# Patient Record
Sex: Female | Born: 1980 | Race: Asian | Hispanic: No | Smoking: Never smoker
Health system: Southern US, Community
[De-identification: ages and names within clinical notes are randomized; demographics above are authoritative.]

## PROBLEM LIST (undated history)

## (undated) DIAGNOSIS — E119 Type 2 diabetes mellitus without complications: Secondary | ICD-10-CM

---

## 2009-07-12 ENCOUNTER — Encounter: Admission: RE | Admit: 2009-07-12 | Discharge: 2009-10-10 | Payer: Self-pay | Admitting: Obstetrics and Gynecology

## 2009-07-12 ENCOUNTER — Ambulatory Visit (HOSPITAL_COMMUNITY): Admission: RE | Admit: 2009-07-12 | Discharge: 2009-07-12 | Payer: Self-pay | Admitting: Obstetrics and Gynecology

## 2019-09-06 ENCOUNTER — Emergency Department (HOSPITAL_COMMUNITY): Payer: BC Managed Care – PPO

## 2019-09-06 ENCOUNTER — Other Ambulatory Visit: Payer: Self-pay

## 2019-09-06 ENCOUNTER — Ambulatory Visit (HOSPITAL_COMMUNITY)
Admission: EM | Admit: 2019-09-06 | Discharge: 2019-09-06 | Disposition: A | Discharge status: Other Acute Inpatient Hospital | Payer: BC Managed Care – PPO | Source: Home / Self Care

## 2019-09-06 ENCOUNTER — Encounter (HOSPITAL_COMMUNITY): Payer: Self-pay | Admitting: Emergency Medicine

## 2019-09-06 ENCOUNTER — Inpatient Hospital Stay (HOSPITAL_COMMUNITY)
Admission: EM | Admit: 2019-09-06 | Discharge: 2019-09-10 | DRG: 177 | Disposition: A | Discharge status: Home / Self Care | Payer: BC Managed Care – PPO | Source: Ambulatory Visit | Attending: Internal Medicine | Admitting: Internal Medicine

## 2019-09-06 DIAGNOSIS — E119 Type 2 diabetes mellitus without complications: Secondary | ICD-10-CM | POA: Diagnosis not present

## 2019-09-06 DIAGNOSIS — R011 Cardiac murmur, unspecified: Secondary | ICD-10-CM | POA: Diagnosis present

## 2019-09-06 DIAGNOSIS — R0602 Shortness of breath: Secondary | ICD-10-CM | POA: Diagnosis present

## 2019-09-06 DIAGNOSIS — J189 Pneumonia, unspecified organism: Secondary | ICD-10-CM

## 2019-09-06 DIAGNOSIS — U071 COVID-19: Secondary | ICD-10-CM | POA: Diagnosis present

## 2019-09-06 DIAGNOSIS — R739 Hyperglycemia, unspecified: Secondary | ICD-10-CM

## 2019-09-06 DIAGNOSIS — J9600 Acute respiratory failure, unspecified whether with hypoxia or hypercapnia: Secondary | ICD-10-CM | POA: Diagnosis present

## 2019-09-06 DIAGNOSIS — Y92230 Patient room in hospital as the place of occurrence of the external cause: Secondary | ICD-10-CM | POA: Diagnosis not present

## 2019-09-06 DIAGNOSIS — E1165 Type 2 diabetes mellitus with hyperglycemia: Secondary | ICD-10-CM | POA: Diagnosis present

## 2019-09-06 DIAGNOSIS — T380X5A Adverse effect of glucocorticoids and synthetic analogues, initial encounter: Secondary | ICD-10-CM | POA: Diagnosis not present

## 2019-09-06 DIAGNOSIS — J9601 Acute respiratory failure with hypoxia: Secondary | ICD-10-CM | POA: Diagnosis not present

## 2019-09-06 DIAGNOSIS — J1282 Pneumonia due to coronavirus disease 2019: Secondary | ICD-10-CM | POA: Diagnosis present

## 2019-09-06 DIAGNOSIS — K0889 Other specified disorders of teeth and supporting structures: Secondary | ICD-10-CM | POA: Diagnosis present

## 2019-09-06 DIAGNOSIS — Z91013 Allergy to seafood: Secondary | ICD-10-CM | POA: Diagnosis not present

## 2019-09-06 DIAGNOSIS — Z833 Family history of diabetes mellitus: Secondary | ICD-10-CM

## 2019-09-06 HISTORY — DX: Type 2 diabetes mellitus without complications: E11.9

## 2019-09-06 LAB — CBC WITH DIFFERENTIAL/PLATELET
Abs Immature Granulocytes: 0.04 10*3/uL (ref 0.00–0.07)
Basophils Absolute: 0 10*3/uL (ref 0.0–0.1)
Basophils Relative: 0 %
Eosinophils Absolute: 0 10*3/uL (ref 0.0–0.5)
Eosinophils Relative: 0 %
HCT: 37.2 % (ref 36.0–46.0)
Hemoglobin: 11.7 g/dL — ABNORMAL LOW (ref 12.0–15.0)
Immature Granulocytes: 1 %
Lymphocytes Relative: 14 %
Lymphs Abs: 1.1 10*3/uL (ref 0.7–4.0)
MCH: 22.7 pg — ABNORMAL LOW (ref 26.0–34.0)
MCHC: 31.5 g/dL (ref 30.0–36.0)
MCV: 72.2 fL — ABNORMAL LOW (ref 80.0–100.0)
Monocytes Absolute: 0.3 10*3/uL (ref 0.1–1.0)
Monocytes Relative: 5 %
Neutro Abs: 6.1 10*3/uL (ref 1.7–7.7)
Neutrophils Relative %: 80 %
Platelets: 292 10*3/uL (ref 150–400)
RBC: 5.15 MIL/uL — ABNORMAL HIGH (ref 3.87–5.11)
RDW: 13.7 % (ref 11.5–15.5)
WBC: 7.6 10*3/uL (ref 4.0–10.5)
nRBC: 0 % (ref 0.0–0.2)

## 2019-09-06 LAB — COMPREHENSIVE METABOLIC PANEL
ALT: 19 U/L (ref 0–44)
AST: 27 U/L (ref 15–41)
Albumin: 3.6 g/dL (ref 3.5–5.0)
Alkaline Phosphatase: 51 U/L (ref 38–126)
Anion gap: 14 (ref 5–15)
BUN: 6 mg/dL (ref 6–20)
CO2: 23 mmol/L (ref 22–32)
Calcium: 9 mg/dL (ref 8.9–10.3)
Chloride: 97 mmol/L — ABNORMAL LOW (ref 98–111)
Creatinine, Ser: 0.81 mg/dL (ref 0.44–1.00)
GFR calc Af Amer: >60 mL/min (ref >60)
GFR calc non Af Amer: >60 mL/min (ref >60)
Glucose, Bld: 233 mg/dL — ABNORMAL HIGH (ref 70–99)
Potassium: 3.8 mmol/L (ref 3.5–5.1)
Sodium: 134 mmol/L — ABNORMAL LOW (ref 135–145)
Total Bilirubin: 0.7 mg/dL (ref 0.3–1.2)
Total Protein: 8.1 g/dL (ref 6.5–8.1)

## 2019-09-06 LAB — URINALYSIS, ROUTINE W REFLEX MICROSCOPIC
Bacteria, UA: NONE SEEN
Bilirubin Urine: NEGATIVE
Glucose, UA: >=500 mg/dL — AB
Hgb urine dipstick: NEGATIVE
Ketones, ur: 80 mg/dL — AB
Leukocytes,Ua: NEGATIVE
Nitrite: NEGATIVE
Protein, ur: 30 mg/dL — AB
Specific Gravity, Urine: 1.01 (ref 1.005–1.030)
pH: 7 (ref 5.0–8.0)

## 2019-09-06 LAB — I-STAT BETA HCG BLOOD, ED (MC, WL, AP ONLY): I-stat hCG, quantitative: <5 m[IU]/mL (ref <5)

## 2019-09-06 LAB — PROTIME-INR
INR: 1.1 (ref 0.8–1.2)
Prothrombin Time: 13.6 seconds (ref 11.4–15.2)

## 2019-09-06 LAB — LACTIC ACID, PLASMA: Lactic Acid, Venous: 1.4 mmol/L (ref 0.5–1.9)

## 2019-09-06 MED ORDER — SODIUM CHLORIDE 0.9 % IV SOLN
100.0000 mg | INTRAVENOUS | Status: AC
  Administered 2019-09-07 (×2): 100 mg via INTRAVENOUS
  Filled 2019-09-06: qty 20

## 2019-09-06 MED ORDER — INSULIN ASPART 100 UNIT/ML ~~LOC~~ SOLN
0.0000 [IU] | Freq: Three times a day (TID) | SUBCUTANEOUS | Status: DC
  Administered 2019-09-07: 8 [IU] via SUBCUTANEOUS
  Administered 2019-09-07: 11 [IU] via SUBCUTANEOUS
  Administered 2019-09-07: 15 [IU] via SUBCUTANEOUS
  Administered 2019-09-08: 8 [IU] via SUBCUTANEOUS
  Administered 2019-09-08 (×2): 15 [IU] via SUBCUTANEOUS
  Administered 2019-09-09: 11 [IU] via SUBCUTANEOUS
  Administered 2019-09-10: 8 [IU] via SUBCUTANEOUS

## 2019-09-06 MED ORDER — AZITHROMYCIN 500 MG IV SOLR
500.0000 mg | Freq: Once | INTRAVENOUS | Status: AC
  Administered 2019-09-06: 23:00:00 500 mg via INTRAVENOUS
  Filled 2019-09-06: qty 500

## 2019-09-06 MED ORDER — ACETAMINOPHEN 500 MG PO TABS
500.0000 mg | ORAL_TABLET | Freq: Once | ORAL | Status: AC
  Administered 2019-09-06: 23:00:00 500 mg via ORAL
  Filled 2019-09-06: qty 1

## 2019-09-06 MED ORDER — CEFTRIAXONE SODIUM 1 G IJ SOLR
1.0000 g | Freq: Once | INTRAMUSCULAR | Status: AC
  Administered 2019-09-06: 23:00:00 1 g via INTRAVENOUS
  Filled 2019-09-06: qty 10

## 2019-09-06 MED ORDER — INSULIN GLARGINE 100 UNIT/ML ~~LOC~~ SOLN
5.0000 [IU] | Freq: Every day | SUBCUTANEOUS | Status: DC
  Administered 2019-09-07: 5 [IU] via SUBCUTANEOUS
  Filled 2019-09-06 (×2): qty 0.05

## 2019-09-06 MED ORDER — INSULIN ASPART 100 UNIT/ML ~~LOC~~ SOLN
0.0000 [IU] | Freq: Every day | SUBCUTANEOUS | Status: DC
  Administered 2019-09-07 (×2): 2 [IU] via SUBCUTANEOUS
  Administered 2019-09-08: 3 [IU] via SUBCUTANEOUS

## 2019-09-06 MED ORDER — SODIUM CHLORIDE 0.9 % IV SOLN
100.0000 mg | Freq: Every day | INTRAVENOUS | Status: DC
  Administered 2019-09-08 – 2019-09-10 (×3): 100 mg via INTRAVENOUS
  Filled 2019-09-06 (×5): qty 20

## 2019-09-06 MED ORDER — ACETAMINOPHEN 325 MG PO TABS
650.0000 mg | ORAL_TABLET | Freq: Once | ORAL | Status: AC
  Administered 2019-09-06: 650 mg via ORAL

## 2019-09-06 MED ORDER — ACETAMINOPHEN 325 MG PO TABS
ORAL_TABLET | ORAL | Status: AC
  Filled 2019-09-06: qty 2

## 2019-09-06 NOTE — ED Triage Notes (Signed)
Pt arrives to ED from [facility] with complaints of shortness of breath and fevers that has worsened since her COVID+ test on 3/9. Patient states her fevers are not controlled by OTC medications and her coughing in uncontrollable. Patient room air sat is 89% and 94% on 2L [place].

## 2019-09-06 NOTE — ED Provider Notes (Signed)
This patient is a ill-appearing 39 year old female coming from urgent care after having increasing shortness of breath and fevers that have worsened over the last week since she was diagnosed with Covid.  The patient has been taking her Tylenol and ibuprofen but continues to be short of breath and febrile.  On exam the patient is hypoxic and has rales in all lung fields.  She is mildly tachypneic, she improves with supplemental oxygen but has a temperature of 101.9, a pulse of close to 100, thankfully no leukocytosis I suspect this is viral.  Nevertheless given the degree to her hypoxia and respiratory distress she will need to be admitted to the hospital, antibiotics have been ordered, I discussed the case with the hospitalist who will admit to the hospital.  Appreciate Dr. Adela Glimpse for seeing and admitting this nice patient.  .Critical Care Performed by: Eber Hong, MD Authorized by: Eber Hong, MD   Critical care provider statement:    Critical care time (minutes):  35   Critical care time was exclusive of:  Separately billable procedures and treating other patients and teaching time   Critical care was necessary to treat or prevent imminent or life-threatening deterioration of the following conditions:  Respiratory failure   Critical care was time spent personally by me on the following activities:  Blood draw for specimens, development of treatment plan with patient or surrogate, discussions with consultants, evaluation of patient's response to treatment, examination of patient, obtaining history from patient or surrogate, ordering and performing treatments and interventions, ordering and review of laboratory studies, ordering and review of radiographic studies, pulse oximetry, re-evaluation of patient's condition and review of old charts    EKG Interpretation  Date/Time:  Tuesday September 06 2019 18:32:08 EDT Ventricular Rate:  109 PR Interval:  128 QRS Duration: 78 QT  Interval:  324 QTC Calculation: 436 R Axis:   24 Text Interpretation: Sinus tachycardia Cannot rule out Anterior infarct , age undetermined Abnormal ECG No old tracing to compare Confirmed by Eber Hong ([id number]) on 09/06/2019 10:54:05 PM      Phyllis Hanson was evaluated in Emergency Department on 09/06/2019 for the symptoms described in the history of present illness. She was evaluated in the context of the global COVID-19 pandemic, which necessitated consideration that the patient might be at risk for infection with the SARS-CoV-2 virus that causes COVID-19. Institutional protocols and algorithms that pertain to the evaluation of patients at risk for COVID-19 are in a state of rapid change based on information released by regulatory bodies including the CDC and federal and state organizations. These policies and algorithms were followed during the patient's care in the ED.  Medical screening examination/treatment/procedure(s) were conducted as a shared visit with non-physician practitioner(s) and myself.  I personally evaluated the patient during the encounter.  Clinical Impression:   Final diagnoses:  Community acquired pneumonia, unspecified laterality  COVID-19  Hyperglycemia  Acute respiratory failure with hypoxia (HCC)         Eber Hong, MD 09/08/19 1529

## 2019-09-06 NOTE — H&P (Addendum)
Phyllis Hanson JQB:[id number] DOB: 1981-05-02 DOA: 09/06/2019     PCP: Patient, No Pcp Per   Outpatient Specialists:  NONE    Patient arrived to ER on 09/06/19 at 1822  Patient coming from: home Lives   With family    Chief Complaint:   Chief Complaint  Patient presents with  . COVID+  . Shortness of Breath    HPI: Phyllis Hanson is a 39 y.o. female with medical history significant of Dm 2    Presented with 10-day history of shoulder back and chest pain generalized body aches.  Denies nausea vomiting or diarrhea.  Initially her daughter and then her son of 43 years old were exposed and tested positive. She herself finally tested positive on 9th of March.  Since that her symptoms has progressed since she has significant shortness of breath which brought into emergency department   Infectious risk factors:  Reports  fever, shortness of breath, dry cough, chest pain,    KNOWN COVID POSITIVE      in house  PCR testing  Pending  No results found for: SARSCOV2NAA   Regarding pertinent Chronic problems:    DM 2 -   diet controlled      While in ER: Noted to be hypoxic requiring 2 L of oxygen chest x-ray with bilateral infiltrates    Hospitalist was called for admission for COVID-19 positive test (U07.1, COVID-19) with Acute Pneumonia (J12.89, Other viral pneumonia) (If respiratory failure or sepsis present, add as separate assessment)   The following Work up has been ordered so far:  Orders Placed This Encounter  Procedures  . Critical Care  . Culture, blood (Routine x 2)  . DG Chest Portable 1 View  . Comprehensive metabolic panel  . Lactic acid, plasma  . CBC with Differential  . Protime-INR  . Urinalysis, Routine w reflex microscopic  . D-dimer, quantitative  . Procalcitonin  . Lactate dehydrogenase  . Ferritin  . Triglycerides  . Fibrinogen  . C-reactive protein  . Diet NPO time specified  . Notify Physician if pt is possible  Sepsis patient  . Document height and weight  . Insert / maintain saline lock  . Cardiac monitoring  . Insert peripheral IV x 2  . Initiate Carrier Fluid Protocol  . Place surgical mask on patient  . Patient to wear surgical mask during transportation  . Assess patient for ability to self-prone. If able (can move self in bed, ambulate) and stable (SpO2 and oxygen requirement):  . RN/NT - Document specific oxygen requirements in CHL  . Notify EDP if new oxygen requirements escalates > 4L per minute [place]  . RN to draw the following extra tubes:  . Consult to hospitalist  ALL PATIENTS BEING ADMITTED/HAVING PROCEDURES NEED COVID-19 SCREENING  . Airborne and Contact precautions  . Pulse oximetry, continuous  . I-Stat beta hCG blood, ED  . EKG 12-Lead  . ED EKG 12-Lead     Following Medications were ordered in ER: Medications  cefTRIAXone (ROCEPHIN) 1 g in sodium chloride 0.9 % 100 mL IVPB (1 g Intravenous New Bag/Given 09/06/19 2249)  azithromycin (ZITHROMAX) 500 mg in sodium chloride 0.9 % 250 mL IVPB (500 mg Intravenous New Bag/Given 09/06/19 2250)  acetaminophen (TYLENOL) tablet 500 mg (500 mg Oral Given 09/06/19 2244)        Consult Orders  (From admission, onward)         Start     Ordered   09/06/19 2243  Consult  to hospitalist  ALL PATIENTS BEING ADMITTED/HAVING PROCEDURES NEED COVID-19 SCREENING  Once    Comments: ALL PATIENTS BEING ADMITTED/HAVING PROCEDURES NEED COVID-19 SCREENING  Provider:  (Not yet assigned)  Question Answer Comment  Place call to: [facility]   Reason for Consult Admit      09/06/19 2243         Significant initial  Findings: Abnormal Labs Reviewed  COMPREHENSIVE METABOLIC PANEL - Abnormal; Notable for the following components:      Result Value   Sodium 134 (*)    Chloride 97 (*)    Glucose, Bld 233 (*)    All other components within normal limits  CBC WITH DIFFERENTIAL/PLATELET - Abnormal; Notable for the following components:   RBC  5.15 (*)    Hemoglobin 11.7 (*)    MCV 72.2 (*)    MCH 22.7 (*)    All other components within normal limits  URINALYSIS, ROUTINE W REFLEX MICROSCOPIC - Abnormal; Notable for the following components:   Glucose, UA >=500 (*)    Ketones, ur 80 (*)    Protein, ur 30 (*)    All other components within normal limits     Otherwise labs showing:    Recent Labs  Lab 09/06/19 1848  NA 134*  K 3.8  CO2 23  GLUCOSE 233*  BUN 6  CREATININE 0.81  CALCIUM 9.0    Cr stable,   Lab Results  Component Value Date   CREATININE 0.81 09/06/2019    Recent Labs  Lab 09/06/19 1848  AST 27  ALT 19  ALKPHOS 51  BILITOT 0.7  PROT 8.1  ALBUMIN 3.6   Lab Results  Component Value Date   CALCIUM 9.0 09/06/2019      WBC      Component Value Date/Time   WBC 7.6 09/06/2019 1848   ANC    Component Value Date/Time   NEUTROABS 6.1 09/06/2019 1848   ALC 1.1    Plt: Lab Results  Component Value Date   PLT 292 09/06/2019     Lactic Acid, Venous    Component Value Date/Time   LATICACIDVEN 1.4 09/06/2019 1900    Procalcitonin  Ordered   HG/HCT stable,     Component Value Date/Time   HGB 11.7 (L) 09/06/2019 1848   HCT 37.2 09/06/2019 1848     ECG: Ordered Personally reviewed by me showing: HR : 109 Rhythm:   Sinus tachycardia    , no evidence of ischemic changes QTC 436     UA  no evidence of UTI  glucoseuria   Urine analysis:    Component Value Date/Time   COLORURINE YELLOW 09/06/2019 2007   APPEARANCEUR CLEAR 09/06/2019 2007   LABSPEC 1.010 09/06/2019 2007   PHURINE 7.0 09/06/2019 2007   GLUCOSEU >=500 (A) 09/06/2019 2007   HGBUR NEGATIVE 09/06/2019 2007   BILIRUBINUR NEGATIVE 09/06/2019 2007   KETONESUR 80 (A) 09/06/2019 2007   PROTEINUR 30 (A) 09/06/2019 2007   NITRITE NEGATIVE 09/06/2019 2007   LEUKOCYTESUR NEGATIVE 09/06/2019 2007    Ordered   CXR - bilateral infitrates      ED Triage Vitals  Enc Vitals Group     BP 09/06/19 1832 (!) 148/82       Pulse Rate 09/06/19 1832 (!) 106     Resp 09/06/19 1832 17     Temp 09/06/19 1832 (!) 101.9 F (38.8 C)     Temp Source 09/06/19 1832 Oral     SpO2 09/06/19 1832 94 %  Weight --      Height --      Head Circumference --      Peak Flow --      Pain Score 09/06/19 1844 5     Pain Loc --      Pain Edu? --      Excl. in GC? --   TMAX(24)@       Latest  Blood pressure 128/80, pulse 86, temperature (!) 101.9 F (38.8 C), temperature source Oral, resp. rate (!) 28, SpO2 97 %.     Review of Systems:    Pertinent positives include: fatigue,  Fevers, chills, shortness of breath at rest.  dyspnea on exertion,  non-productive cough, Constitutional:  No weight loss, night sweats,  weight loss  HEENT:  No headaches, Difficulty swallowing,Tooth/dental problems,Sore throat,  No sneezing, itching, ear ache, nasal congestion, post nasal drip,  Cardio-vascular:  No chest pain, Orthopnea, PND, anasarca, dizziness, palpitations.no Bilateral lower extremity swelling  GI:  No heartburn, indigestion, abdominal pain, nausea, vomiting, diarrhea, change in bowel habits, loss of appetite, melena, blood in stool, hematemesis Resp:   No excess mucus, no productive cough,  No coughing up of blood.No change in color of mucus.No wheezing. Skin:  no rash or lesions. No jaundice GU:  no dysuria, change in color of urine, no urgency or frequency. No straining to urinate.  No flank pain.  Musculoskeletal:  No joint pain or no joint swelling. No decreased range of motion. No back pain.  Psych:  No change in mood or affect. No depression or anxiety. No memory loss.  Neuro: no localizing neurological complaints, no tingling, no weakness, no double vision, no gait abnormality, no slurred speech, no confusion  All systems reviewed and apart from HOPI all are negative  Past Medical History:   Past Medical History:  Diagnosis Date  . Diabetes mellitus without complication (HCC)     History  reviewed. No pertinent surgical history.  Social History:  Ambulatory  independently       reports that she has never smoked. She has never used smokeless tobacco. She reports previous alcohol use. She reports that she does not use drugs.   Family History:   Family History  Problem Relation Age of Onset  . Diabetes Mother     Allergies: No Known Allergies   Prior to Admission medications   Not on File   Physical Exam: Blood pressure 128/80, pulse 86, temperature (!) 101.9 F (38.8 C), temperature source Oral, resp. rate (!) 28, SpO2 97 %. 1. General:  in No  Acute distress    acutely ill -appearing 2. Psychological: Alert and   Oriented 3. Head/ENT:    Dry Mucous Membranes                          Head Non traumatic, neck supple                             Poor Dentition 4. SKIN:  decreased Skin turgor,  Skin clean Dry and intact no rash 5. Heart: Regular rate and rhythm no Murmur, no Rub or gallop 6. Lungs:  no wheezes or crackles   7. Abdomen: Soft,  non-tender, Non distended  obese  bowel sounds present 8. Lower extremities: no clubbing, cyanosis, no edema 9. Neurologically Grossly intact, moving all 4 extremities equally   10. MSK: Normal range of motion   All other  LABS:     Recent Labs  Lab 09/06/19 1848  WBC 7.6  NEUTROABS 6.1  HGB 11.7*  HCT 37.2  MCV 72.2*  PLT 292     Recent Labs  Lab 09/06/19 1848  NA 134*  K 3.8  CL 97*  CO2 23  GLUCOSE 233*  BUN 6  CREATININE 0.81  CALCIUM 9.0     Recent Labs  Lab 09/06/19 1848  AST 27  ALT 19  ALKPHOS 51  BILITOT 0.7  PROT 8.1  ALBUMIN 3.6       Cultures: No results found for: SDES, SPECREQUEST, CULT, REPTSTATUS   Radiological Exams on Admission: DG Chest Portable 1 View  Result Date: 09/06/2019 CLINICAL DATA:  Shortness of breath over the last day. EXAM: PORTABLE CHEST 1 VIEW COMPARISON:  None. FINDINGS: Heart size is normal. There are patchy bilateral pulmonary infiltrates. The  pattern is suggestive of viral pneumonia. Bacterial pneumonia is possible. No evidence of dense consolidation or lobar collapse. No bone abnormality. IMPRESSION: Widespread bilateral patchy pulmonary infiltrates. Question coronavirus pneumonia. Electronically Signed   By: Nelson Chimes M.D.   On: 09/06/2019 19:58    Chart has been reviewed    Assessment/Plan  39 y.o. female with medical history significant of Dm 2  Admitted for COVID PNA  Present on Admission: . Pneumonia due to COVID-19 virus -  FROM HOME   WITH KNOWN HX OF COVID19     Following concerning LAB/ imaging findings:     CXR: hazy bilateral peripheral opacities or otherwise abnormal      -Following work-up initiated:      sputum culture Ordered 09/06/19,     Following complications noted:    acute respiratory failure with hypoxia - continue oxygen treatment  Plan of treatment:   Admit on Airborn Precautions to Current facility   -given severity of illness initiate steroids Decadron 6mg  q 24 hours And pharmacy consult for remdesivir   - Will follow daily d.dimer - Assess for ability to prone  - Supportive management -Fluid sparing resuscitation  -Provide oxygen as needed currently on  SpO2: 97 % O2 Flow Rate (L/min): 2 L/min FiO2 (%): (!) 2 % - IF d.dimer elvated >5 will increase dose of lovenox -in ER given IV antibiotics will wait for result of pro calcitonin and hold off for now    Poor Prognostic factors  39 y.o.  Personal hx of DM2      Will order Airborne and Contact precautions  Family/ patient prognosis discussion: I have discussed case with the  patient  who are aware of their prognosis At this point they would like  to be full code    The treatment plan and use of medications and known side effects were discussed with patient . It was clearly explained that there is no proven definitive treatment for COVID-19 infection yet. Any medications used here are based on case reports/anecdotal data  which are not peer-reviewed and has not been studied using randomized control trials.  Complete risks and long-term side effects are unknown, however in the best clinical judgment they seem to be of some clinical benefit rather than medical risks.  Patient agree with the treatment plan and want to receive these treatments as indicated.     . Acute respiratory failuAre due to COVID-19 (HCC)-right oxygen as needed monitor for any worsening, prone positioning if needed If d.dimer significantly elevated would benefit from CTA  DM 2-  - Order Sensitive   SSI    -  check TSH and HgA1C   Add LAntus 5 units QHS and titrate as needed to improve Dm control in the setting of COVID  Other plan as per orders.  DVT prophylaxis:   Lovenox     Code Status:  FULL CODE as per patient    I had personally discussed CODE STATUS with patient   Family Communication:   Family not at  Bedside   Disposition Plan:      To home once workup is complete and patient is stable   Following barriers for discharge:                                                            Afebrile,  Off remdesivir                             Will need to be able to tolerate PO                            Will likely need home health, home O2, set up                                        Consults called: none  Admission status:  ED Disposition    ED Disposition Condition Comment   Admit  Hospital Area: [facility] [[id number]]  Level of Care: Telemetry Medical [[id number]]  May admit patient to [facility] or [facility] if equivalent level of care is available:: Yes  Covid Evaluation: Confirmed COVID Positive  Diagnosis: Pneumonia due to COVID-19 virus [[id number]]  Admitting Physician: Therisa Doyne [[id number]]  Attending Physician: Therisa Doyne [[id number]]  Estimated length of stay: past midnight tomorrow  Certification:: I certify this patient will need inpatient services for at least 2 midnights          inpatient     I Expect 2 midnight stay secondary to severity of patient's current illness need for inpatient interventions justified by the following:  hemodynamic instability despite optimal treatment ( hypoxia,  ) * Severe lab/radiological/exam abnormalities including:     and extensive comorbidities including:    DM2  .    That are currently affecting medical management.   I expect  patient to be hospitalized for 2 midnights requiring inpatient medical care.  Patient is at high risk for adverse outcome (such as loss of life or disability) if not treated.    New or worsening hypoxia  Need for IV antivirals      Level of care   Tele indefinitely please discontinue once patient no longer qualifies   Precautions: admitted as covid positive Airborne and Contact precautions   PPE: Used by the provider:   P100  eye Goggles,  Gloves  gown    Patte Winkel 09/06/2019, 11:46 PM    [facility]     after 2 AM please page floor coverage PA If 7AM-7PM, please contact the day team taking care of the patient using [email or web address]   Patient was evaluated in the context of the global COVID-19 pandemic, which necessitated consideration that the patient might be at risk for infection  with the SARS-CoV-2 virus that causes COVID-19. Institutional protocols and algorithms that pertain to the evaluation of patients at risk for COVID-19 are in a state of rapid change based on information released by regulatory bodies including the CDC and federal and state organizations. These policies and algorithms were followed during the patient's care.

## 2019-09-06 NOTE — ED Notes (Signed)
Patient is being discharged from the Urgent Care Center and sent to the Emergency Department via wheelchair by staff. Per SM, patient is stable but in need of higher level of care due to decreased O2 sats covid infection. Patient is aware and verbalizes understanding of plan of care.  Vitals:   09/06/19 1807  BP: (!) 156/77  Pulse: (!) 112  Resp: (!) 22  Temp: (!) 102.4 F (39.1 C)  SpO2: (!) 89%

## 2019-09-06 NOTE — ED Provider Notes (Signed)
[facility] EMERGENCY DEPARTMENT Provider Note   CSN: [id number] Arrival date & time: 09/06/19  1822     History Chief Complaint  Patient presents with  . COVID+  . Shortness of Breath    Phyllis Hanson is a 39 y.o. female.  HPI HPI Comments: Phyllis Hanson is a 39 y.o. female with a history of diabetes mellitus who presents to the Emergency Department complaining of worsening cough.  Patient states that she was diagnosed with COVID-19 on March 9.  She states she initially felt better for 1 to 2 days and then began experiencing a worsening of her symptoms.  She notes associated diffuse anterior chest pain, upper back pain, shortness of breath, fevers, chills, rhinorrhea, congestion, lightheadedness, fatigue.  She states that she has been taking DayQuil, NyQuil, Tylenol every 6 hours for her symptoms with minimal relief.  She was seen in urgent care prior to arrival.  Her oxygen saturations were 89% upon arrival and increased to 94% once placed on 2 L of nasal cannula.  She denies headache, dizziness, syncope.    Past Medical History:  Diagnosis Date  . Diabetes mellitus without complication (HCC)     There are no problems to display for this patient.   History reviewed. No pertinent surgical history.   OB History   No obstetric history on file.     History reviewed. No pertinent family history.  Social History   Tobacco Use  . Smoking status: Never Smoker  . Smokeless tobacco: Never Used  Substance Use Topics  . Alcohol use: Not Currently  . Drug use: Never    Home Medications Prior to Admission medications   Not on File    Allergies    Patient has no known allergies.  Review of Systems   Review of Systems  Constitutional: Positive for activity change, chills, fatigue and fever.  HENT: Positive for congestion and rhinorrhea.   Respiratory: Positive for cough, chest tightness and shortness of breath.   Cardiovascular:  Positive for chest pain.  Gastrointestinal: Negative for diarrhea, nausea and vomiting.  Genitourinary: Negative for difficulty urinating, dysuria and hematuria.  Musculoskeletal: Positive for myalgias (Body aches).  All other systems reviewed and are negative.  Physical Exam Updated Vital Signs BP (!) 148/82 (BP Location: Right Arm)   Pulse (!) 106   Temp (!) 101.9 F (38.8 C) (Oral)   Resp 17   SpO2 98%   Physical Exam Vitals and nursing note reviewed.  Constitutional:      General: She is in acute distress.     Appearance: She is well-developed and normal weight. She is not ill-appearing, toxic-appearing or diaphoretic.  HENT:     Head: Normocephalic and atraumatic.  Eyes:     Extraocular Movements: Extraocular movements intact.     Pupils: Pupils are equal, round, and reactive to light.  Cardiovascular:     Rate and Rhythm: Normal rate and regular rhythm.     Heart sounds: Murmur (Systolic murmur best heard at the left upper sternal border) present.  Pulmonary:     Effort: Pulmonary effort is normal. No tachypnea, accessory muscle usage or respiratory distress.     Breath sounds: Normal breath sounds. No stridor. No decreased breath sounds, wheezing, rhonchi or rales.     Comments: Lungs clear to auscultation bilaterally.  Patient exhibits significant coughing bouts with deep inspiration. Chest:     Chest wall: No tenderness.  Abdominal:     Palpations: Abdomen is soft.  Tenderness: There is no abdominal tenderness.  Musculoskeletal:     Cervical back: Normal range of motion and neck supple.  Skin:    General: Skin is warm and dry.  Neurological:     General: No focal deficit present.     Mental Status: She is alert and oriented to person, place, and time.  Psychiatric:        Mood and Affect: Mood normal.        Behavior: Behavior normal.    ED Results / Procedures / Treatments   Labs (all labs ordered are listed, but only abnormal results are  displayed) Labs Reviewed  COMPREHENSIVE METABOLIC PANEL - Abnormal; Notable for the following components:      Result Value   Sodium 134 (*)    Chloride 97 (*)    Glucose, Bld 233 (*)    All other components within normal limits  CBC WITH DIFFERENTIAL/PLATELET - Abnormal; Notable for the following components:   RBC 5.15 (*)    Hemoglobin 11.7 (*)    MCV 72.2 (*)    MCH 22.7 (*)    All other components within normal limits  URINALYSIS, ROUTINE W REFLEX MICROSCOPIC - Abnormal; Notable for the following components:   Glucose, UA >=500 (*)    Ketones, ur 80 (*)    Protein, ur 30 (*)    All other components within normal limits  CULTURE, BLOOD (ROUTINE X 2)  CULTURE, BLOOD (ROUTINE X 2)  LACTIC ACID, PLASMA  PROTIME-INR  LACTIC ACID, PLASMA  I-STAT BETA HCG BLOOD, ED ([facility], [facility], [facility] ONLY)   EKG None  Radiology DG Chest Portable 1 View  Result Date: 09/06/2019 CLINICAL DATA:  Shortness of breath over the last day. EXAM: PORTABLE CHEST 1 VIEW COMPARISON:  None. FINDINGS: Heart size is normal. There are patchy bilateral pulmonary infiltrates. The pattern is suggestive of viral pneumonia. Bacterial pneumonia is possible. No evidence of dense consolidation or lobar collapse. No bone abnormality. IMPRESSION: Widespread bilateral patchy pulmonary infiltrates. Question coronavirus pneumonia. Electronically Signed   By: Paulina Fusi M.D.   On: 09/06/2019 19:58   Procedures Procedures   Medications Ordered in ED Medications  acetaminophen (TYLENOL) tablet 500 mg (has no administration in time range)  cefTRIAXone (ROCEPHIN) 1 g in sodium chloride 0.9 % 100 mL IVPB (has no administration in time range)  azithromycin (ZITHROMAX) 500 mg in sodium chloride 0.9 % 250 mL IVPB (has no administration in time range)    ED Course  I have reviewed the triage vital signs and the nursing notes.  Pertinent labs & imaging results that were available during my care of the patient were reviewed by me and  considered in my medical decision making (see chart for details).    MDM Rules/Calculators/A&P                      10:00 PM patient is a 39 year old old female with a history of diabetes mellitus that presents with worsening COVID-19 symptoms. She was initially dx with COVID-19 on 08/30/2019. She went to [facility] due to her worsening symptoms and was then sent to the ED for further evaluation due to shortness of breath with oxygen saturations of 89%.  Chest x-ray shows widespread bilateral patchy pulmonary infiltrates consistent with Covid pneumonia.  Initial labs show hyperglycemia at 233 with ketonuria, likely secondary to acute infection.  She states she was on metformin in the past but was taken off this medication about one year ago  after normalizing her A1C with diet/exercise. Lactic acid is not elevated.  Blood cultures collected.  Will treat empirically for pneumonia with Rocephin and azithromycin.  Will discuss with patient who will likely be admitted.  Patient discussed with my attending physician Dr. Noemi Chapel who agrees the above plan.  10:29 PM reevaluated patient who is currently saturating around 95%.  Discussed the above plan with the patient, including possible admission, and she is amenable.  Will work with Dr. Sabra Heck to discuss with the hospitalist team for admission.  Final Clinical Impression(s) / ED Diagnoses Final diagnoses:  Community acquired pneumonia, unspecified laterality  COVID-19  Hyperglycemia    Rx / DC Orders ED Discharge Orders    None       Rayna Sexton, PA-C 09/06/19 2258    Noemi Chapel, MD 09/08/19 1529

## 2019-09-06 NOTE — ED Triage Notes (Signed)
Pt here for fever and SOB with known covid infection; pt noted to have increased respirations and increased coughing; O2 sats 89% on RA

## 2019-09-07 DIAGNOSIS — J9601 Acute respiratory failure with hypoxia: Secondary | ICD-10-CM

## 2019-09-07 LAB — GLUCOSE, CAPILLARY
Glucose-Capillary: 377 mg/dL — ABNORMAL HIGH (ref 70–99)
Glucose-Capillary: 316 mg/dL — ABNORMAL HIGH (ref 70–99)
Glucose-Capillary: 248 mg/dL — ABNORMAL HIGH (ref 70–99)

## 2019-09-07 LAB — HEMOGLOBIN A1C
Hgb A1c MFr Bld: 12 % — ABNORMAL HIGH (ref 4.8–5.6)
Mean Plasma Glucose: 297.7 mg/dL

## 2019-09-07 LAB — COMPREHENSIVE METABOLIC PANEL
ALT: 15 U/L (ref 0–44)
AST: 27 U/L (ref 15–41)
Albumin: 2.7 g/dL — ABNORMAL LOW (ref 3.5–5.0)
Alkaline Phosphatase: 39 U/L (ref 38–126)
Anion gap: 14 (ref 5–15)
BUN: <5 mg/dL — ABNORMAL LOW (ref 6–20)
CO2: 19 mmol/L — ABNORMAL LOW (ref 22–32)
Calcium: 7.4 mg/dL — ABNORMAL LOW (ref 8.9–10.3)
Chloride: 104 mmol/L (ref 98–111)
Creatinine, Ser: 0.61 mg/dL (ref 0.44–1.00)
GFR calc Af Amer: >60 mL/min (ref >60)
GFR calc non Af Amer: >60 mL/min (ref >60)
Glucose, Bld: 201 mg/dL — ABNORMAL HIGH (ref 70–99)
Potassium: 3.3 mmol/L — ABNORMAL LOW (ref 3.5–5.1)
Sodium: 137 mmol/L (ref 135–145)
Total Bilirubin: 0.6 mg/dL (ref 0.3–1.2)
Total Protein: 6.2 g/dL — ABNORMAL LOW (ref 6.5–8.1)

## 2019-09-07 LAB — CBC WITH DIFFERENTIAL/PLATELET
Abs Immature Granulocytes: 0.02 10*3/uL (ref 0.00–0.07)
Basophils Absolute: 0 10*3/uL (ref 0.0–0.1)
Basophils Relative: 0 %
Eosinophils Absolute: 0 10*3/uL (ref 0.0–0.5)
Eosinophils Relative: 0 %
HCT: 30.3 % — ABNORMAL LOW (ref 36.0–46.0)
Hemoglobin: 9.4 g/dL — ABNORMAL LOW (ref 12.0–15.0)
Immature Granulocytes: 0 %
Lymphocytes Relative: 19 %
Lymphs Abs: 1.1 10*3/uL (ref 0.7–4.0)
MCH: 22.9 pg — ABNORMAL LOW (ref 26.0–34.0)
MCHC: 31 g/dL (ref 30.0–36.0)
MCV: 73.7 fL — ABNORMAL LOW (ref 80.0–100.0)
Monocytes Absolute: 0.3 10*3/uL (ref 0.1–1.0)
Monocytes Relative: 5 %
Neutro Abs: 4.6 10*3/uL (ref 1.7–7.7)
Neutrophils Relative %: 76 %
Platelets: 244 10*3/uL (ref 150–400)
RBC: 4.11 MIL/uL (ref 3.87–5.11)
RDW: 13.9 % (ref 11.5–15.5)
WBC: 6 10*3/uL (ref 4.0–10.5)
nRBC: 0 % (ref 0.0–0.2)

## 2019-09-07 LAB — HIV ANTIBODY (ROUTINE TESTING W REFLEX): HIV Screen 4th Generation wRfx: NONREACTIVE

## 2019-09-07 LAB — CBG MONITORING, ED
Glucose-Capillary: 212 mg/dL — ABNORMAL HIGH (ref 70–99)
Glucose-Capillary: 265 mg/dL — ABNORMAL HIGH (ref 70–99)

## 2019-09-07 LAB — LACTIC ACID, PLASMA: Lactic Acid, Venous: 2.2 mmol/L (ref 0.5–1.9)

## 2019-09-07 LAB — FERRITIN
Ferritin: 390 ng/mL — ABNORMAL HIGH (ref 11–307)
Ferritin: 306 ng/mL (ref 11–307)

## 2019-09-07 LAB — LACTATE DEHYDROGENASE: LDH: 264 U/L — ABNORMAL HIGH (ref 98–192)

## 2019-09-07 LAB — D-DIMER, QUANTITATIVE
D-Dimer, Quant: 1.14 ug/mL-FEU — ABNORMAL HIGH (ref 0.00–0.50)
D-Dimer, Quant: 1.04 ug/mL-FEU — ABNORMAL HIGH (ref 0.00–0.50)

## 2019-09-07 LAB — FIBRINOGEN: Fibrinogen: >800 mg/dL — ABNORMAL HIGH (ref 210–475)

## 2019-09-07 LAB — C-REACTIVE PROTEIN
CRP: 14.1 mg/dL — ABNORMAL HIGH (ref <1.0)
CRP: 11.2 mg/dL — ABNORMAL HIGH (ref <1.0)

## 2019-09-07 LAB — TRIGLYCERIDES: Triglycerides: 146 mg/dL (ref <150)

## 2019-09-07 LAB — MAGNESIUM: Magnesium: 1.5 mg/dL — ABNORMAL LOW (ref 1.7–2.4)

## 2019-09-07 LAB — ABO/RH: ABO/RH(D): AB POS

## 2019-09-07 LAB — PROCALCITONIN: Procalcitonin: <0.1 ng/mL

## 2019-09-07 LAB — PHOSPHORUS: Phosphorus: 2.5 mg/dL (ref 2.5–4.6)

## 2019-09-07 MED ORDER — SODIUM CHLORIDE 0.9 % IV SOLN: 250.0000 mL | INTRAVENOUS | Status: DC | PRN

## 2019-09-07 MED ORDER — HYDROCODONE-ACETAMINOPHEN 5-325 MG PO TABS: 1.0000 | ORAL_TABLET | ORAL | Status: DC | PRN

## 2019-09-07 MED ORDER — SODIUM CHLORIDE 0.9% FLUSH
3.0000 mL | Freq: Two times a day (BID) | INTRAVENOUS | Status: DC
  Administered 2019-09-07 – 2019-09-10 (×8): 3 mL via INTRAVENOUS

## 2019-09-07 MED ORDER — SODIUM CHLORIDE 0.9 % IV SOLN: 100.0000 mg | Freq: Every day | INTRAVENOUS | Status: DC

## 2019-09-07 MED ORDER — SODIUM CHLORIDE 0.9 % IV SOLN: 200.0000 mg | Freq: Once | INTRAVENOUS | Status: DC

## 2019-09-07 MED ORDER — INSULIN GLARGINE 100 UNIT/ML ~~LOC~~ SOLN
15.0000 [IU] | Freq: Every day | SUBCUTANEOUS | Status: DC
  Administered 2019-09-07 – 2019-09-08 (×2): 15 [IU] via SUBCUTANEOUS
  Filled 2019-09-07 (×2): qty 0.15

## 2019-09-07 MED ORDER — INSULIN STARTER KIT- PEN NEEDLES (ENGLISH)
1.0000 | Freq: Once | Status: DC
  Filled 2019-09-07: qty 1

## 2019-09-07 MED ORDER — POTASSIUM CHLORIDE CRYS ER 20 MEQ PO TBCR
40.0000 meq | EXTENDED_RELEASE_TABLET | Freq: Once | ORAL | Status: AC
  Administered 2019-09-07: 40 meq via ORAL
  Filled 2019-09-07: qty 2

## 2019-09-07 MED ORDER — ENOXAPARIN SODIUM 40 MG/0.4ML ~~LOC~~ SOLN
40.0000 mg | SUBCUTANEOUS | Status: DC
  Administered 2019-09-07 – 2019-09-09 (×3): 40 mg via SUBCUTANEOUS
  Filled 2019-09-07 (×4): qty 0.4

## 2019-09-07 MED ORDER — ONDANSETRON HCL 4 MG PO TABS: 4.0000 mg | ORAL_TABLET | Freq: Four times a day (QID) | ORAL | Status: DC | PRN

## 2019-09-07 MED ORDER — GUAIFENESIN-DM 100-10 MG/5ML PO SYRP
10.0000 mL | ORAL_SOLUTION | ORAL | Status: DC | PRN
  Administered 2019-09-07 – 2019-09-09 (×4): 10 mL via ORAL
  Filled 2019-09-07 (×4): qty 10

## 2019-09-07 MED ORDER — ONDANSETRON HCL 4 MG/2ML IJ SOLN: 4.0000 mg | Freq: Four times a day (QID) | INTRAMUSCULAR | Status: DC | PRN

## 2019-09-07 MED ORDER — SODIUM CHLORIDE 0.9% FLUSH: 3.0000 mL | INTRAVENOUS | Status: DC | PRN

## 2019-09-07 MED ORDER — LACTATED RINGERS IV SOLN: INTRAVENOUS | Status: AC

## 2019-09-07 MED ORDER — ACETAMINOPHEN 325 MG PO TABS
650.0000 mg | ORAL_TABLET | Freq: Four times a day (QID) | ORAL | Status: DC | PRN
  Administered 2019-09-09: 20:00:00 650 mg via ORAL
  Filled 2019-09-07: qty 2

## 2019-09-07 MED ORDER — LIVING WELL WITH DIABETES BOOK
Freq: Once | Status: DC
  Filled 2019-09-07: qty 1

## 2019-09-07 MED ORDER — SODIUM CHLORIDE 0.9% FLUSH
3.0000 mL | Freq: Two times a day (BID) | INTRAVENOUS | Status: DC
  Administered 2019-09-07: 3 mL via INTRAVENOUS

## 2019-09-07 MED ORDER — DEXAMETHASONE SODIUM PHOSPHATE 10 MG/ML IJ SOLN
6.0000 mg | INTRAMUSCULAR | Status: DC
  Administered 2019-09-07 – 2019-09-09 (×3): 6 mg via INTRAVENOUS
  Filled 2019-09-07 (×3): qty 1

## 2019-09-07 MED ORDER — MAGNESIUM SULFATE 2 GM/50ML IV SOLN
2.0000 g | Freq: Once | INTRAVENOUS | Status: AC
  Administered 2019-09-07: 2 g via INTRAVENOUS
  Filled 2019-09-07: qty 50

## 2019-09-07 NOTE — Progress Notes (Signed)
PROGRESS NOTE                                                                                                                                                                                                             Patient Demographics:    Phyllis Hanson, is a 39 y.o. female, DOB - June 11, 1981, EQA:[id number]  Outpatient Primary MD for the patient is Patient, No Pcp Per    LOS - 1  Admit date - 09/06/2019    Chief Complaint  Patient presents with  . COVID POS  . Shortness of Breath       Brief Narrative 39 year old female history of DM type II presented to the hospital with generalized abdominal pain and some shortness of breath, work-up suggestive of COVID-19 pneumonia, she had initially tested positive on March 9.  She was admitted to the hospital for further care.   Subjective:    Phyllis Hanson today has, No headache, No chest pain, No abdominal pain - No Nausea, No new weakness tingling or numbness, improved SOB.      Assessment  & Plan :     1. Acute Hypoxic Resp. Failure due to Acute Covid 19 Viral Pneumonitis during the ongoing 2020 Covid 19 Pandemic - she has mild to moderate disease, she has been started on steroids and IV remdesivir, continue to monitor closely.  Clinically seems to have stabilized.  CRP is trending down.  Will monitor closely.  In case her COVID-19 pneumonia gets worse she has consented for Actemra.  Encouraged the patient to sit up in chair in the daytime use I-S and flutter valve for pulmonary toiletry and then prone in bed when at night.  Actemra off label use - patient was told that if COVID-19 pneumonitis gets worse we might potentially use Actemra off label, patient denies any known history of tuberculosis or hepatitis, understands the risks and benefits and wants to proceed with Actemra treatment if required.    SpO2: 97 % O2 Flow Rate (L/min): 3 L/min FiO2 (%):  (!) 2 %  Recent Labs  Lab 09/07/19 0118 09/07/19 0430  CRP 14.1* 11.2*  DDIMER 1.14* 1.04*  PROCALCITON <0.10  --     Hepatic Function Latest Ref Rng & Units 09/07/2019 09/06/2019  Total Protein 6.5 - 8.1 g/dL 6.2(L) 8.1  Albumin 3.5 - 5.0 g/dL 2.7(L)  3.6  AST 15 - 41 U/L 27 27  ALT 0 - 44 U/L 15 19  Alk Phosphatase 38 - 126 U/L 39 51  Total Bilirubin 0.3 - 1.2 mg/dL 0.6 0.7    2.  DM type II, Lantus dose increased as sugars are running high due to steroids, continue sliding scale.  Monitor and adjust.  A1c suggests poor outpatient control due to hyperglycemia.  Lab Results  Component Value Date   HGBA1C 12.0 (H) 09/07/2019   CBG (last 3)  Recent Labs    09/07/19 0409 09/07/19 0818  GLUCAP 212* 265*        Condition - Fair   Family Communication  :  Wrong number listed in the chart  Code Status :  Full  Diet :   Diet Order            Diet Carb Modified Fluid consistency: Thin; Room service appropriate? Yes  Diet effective now               Disposition Plan  : Stay in the hospital and finish COVID-19 pneumonia treatment  Consults  :  None  Procedures  :  None  PUD Prophylaxis : None  DVT Prophylaxis  :  Lovenox    Lab Results  Component Value Date   PLT 244 09/07/2019    Inpatient Medications  Scheduled Meds: . dexamethasone (DECADRON) injection  6 mg Intravenous Q24H  . enoxaparin (LOVENOX) injection  40 mg Subcutaneous Q24H  . insulin aspart  0-15 Units Subcutaneous TID WC  . insulin aspart  0-5 Units Subcutaneous QHS  . insulin glargine  15 Units Subcutaneous Daily  . sodium chloride flush  3 mL Intravenous Q12H   Continuous Infusions: . lactated ringers 125 mL/hr at 09/07/19 0826  . [START ON 09/08/2019] remdesivir 100 mg in NS 100 mL     PRN Meds:.acetaminophen, guaiFENesin-dextromethorphan, HYDROcodone-acetaminophen, [DISCONTINUED] ondansetron **OR** ondansetron (ZOFRAN) IV  Antibiotics  :    Anti-infectives (From admission,  onward)   Start     Dose/Rate Route Frequency Ordered Stop   09/08/19 1000  remdesivir 100 mg in sodium chloride 0.9 % 100 mL IVPB  Status:  Discontinued     100 mg 200 mL/hr over 30 Minutes Intravenous Daily 09/07/19 0241 09/07/19 0304   09/08/19 1000  remdesivir 100 mg in sodium chloride 0.9 % 100 mL IVPB     100 mg 200 mL/hr over 30 Minutes Intravenous Daily 09/06/19 2356 09/12/19 0959   09/07/19 0245  remdesivir 200 mg in sodium chloride 0.9% 250 mL IVPB  Status:  Discontinued     200 mg 580 mL/hr over 30 Minutes Intravenous Once 09/07/19 0241 09/07/19 0304   09/07/19 0000  remdesivir 100 mg in sodium chloride 0.9 % 100 mL IVPB     100 mg 200 mL/hr over 30 Minutes Intravenous Every 30 min 09/06/19 2356 09/07/19 0234   09/06/19 2200  cefTRIAXone (ROCEPHIN) 1 g in sodium chloride 0.9 % 100 mL IVPB     1 g 200 mL/hr over 30 Minutes Intravenous  Once 09/06/19 2158 09/06/19 2332   09/06/19 2200  azithromycin (ZITHROMAX) 500 mg in sodium chloride 0.9 % 250 mL IVPB     500 mg 250 mL/hr over 60 Minutes Intravenous  Once 09/06/19 2158 09/07/19 0043       Time Spent in minutes  30   Lala Lund M.D on 09/07/2019 at 11:22 AM  To page go to [email or web address] - password [email or web address]  [facility] -  Office  [phone]    See all Orders from today for further details    Objective:   Vitals:   09/07/19 0745 09/07/19 0800 09/07/19 0815 09/07/19 0944  BP: 106/61 105/66 108/64 113/66  Pulse: 72 71 79 81  Resp: (!) 26  (!) 25 (!) 22  Temp:    98.6 F (37 C)  TempSrc:    Oral  SpO2: 97% 97% 98% 97%  Weight:      Height:        Wt Readings from Last 3 Encounters:  09/07/19 79.4 kg    No intake or output data in the 24 hours ending 09/07/19 1122   Physical Exam  Awake Alert, No new F.N deficits, Normal affect [place].AT,PERRAL Supple Neck,No JVD, No cervical lymphadenopathy appriciated.  Symmetrical Chest wall movement, Good air movement bilaterally, CTAB RRR,No Gallops,Rubs or  new Murmurs, No Parasternal Heave +ve B.Sounds, Abd Soft, No tenderness, No organomegaly appriciated, No rebound - guarding or rigidity. No Cyanosis, Clubbing or edema, No new Rash or bruise      Data Review:    CBC Recent Labs  Lab 09/06/19 1848 09/07/19 0430  WBC 7.6 6.0  HGB 11.7* 9.4*  HCT 37.2 30.3*  PLT 292 244  MCV 72.2* 73.7*  MCH 22.7* 22.9*  MCHC 31.5 31.0  RDW 13.7 13.9  LYMPHSABS 1.1 1.1  MONOABS 0.3 0.3  EOSABS 0.0 0.0  BASOSABS 0.0 0.0    Chemistries  Recent Labs  Lab 09/06/19 1848 09/07/19 0126 09/07/19 0430  NA 134*  --  137  K 3.8  --  3.3*  CL 97*  --  104  CO2 23  --  19*  GLUCOSE 233*  --  201*  BUN 6  --  <5*  CREATININE 0.81  --  0.61  CALCIUM 9.0  --  7.4*  AST 27  --  27  ALT 19  --  15  ALKPHOS 51  --  39  BILITOT 0.7  --  0.6  MG  --   --  1.5*  INR 1.1  --   --   HGBA1C  --  12.0*  --      ------------------------------------------------------------------------------------------------------------------ Recent Labs    09/07/19 0118  TRIG 146    Lab Results  Component Value Date   HGBA1C 12.0 (H) 09/07/2019   ------------------------------------------------------------------------------------------------------------------ No results for input(s): TSH, T4TOTAL, T3FREE, THYROIDAB in the last 72 hours.  Invalid input(s): FREET3  Cardiac Enzymes No results for input(s): CKMB, TROPONINI, MYOGLOBIN in the last 168 hours.  Invalid input(s): CK ------------------------------------------------------------------------------------------------------------------ No results found for: BNP  Micro Results Recent Results (from the past 240 hour(s))  Culture, blood (Routine x 2)     Status: None (Preliminary result)   Collection Time: 09/06/19  6:49 PM   Specimen: BLOOD  Result Value Ref Range Status   Specimen Description BLOOD LEFT ANTECUBITAL  Final   Special Requests   Final    BOTTLES DRAWN AEROBIC ONLY Blood Culture  adequate volume   Culture   Final    NO GROWTH < 12 HOURS Performed at [facility], [address] [place], [place] [address]    Report Status PENDING  Incomplete    Radiology Reports DG Chest Portable 1 View  Result Date: 09/06/2019 CLINICAL DATA:  Shortness of breath over the last day. EXAM: PORTABLE CHEST 1 VIEW COMPARISON:  None. FINDINGS: Heart size is normal. There are patchy bilateral pulmonary infiltrates. The pattern is suggestive of viral  pneumonia. Bacterial pneumonia is possible. No evidence of dense consolidation or lobar collapse. No bone abnormality. IMPRESSION: Widespread bilateral patchy pulmonary infiltrates. Question coronavirus pneumonia. Electronically Signed   By: Nelson Chimes M.D.   On: 09/06/2019 19:58

## 2019-09-07 NOTE — Progress Notes (Signed)
Phyllis Hanson is a 39 y.o. female patient admitted from ED awake, alert - oriented  X 4 - no acute distress noted.  VSS - Blood pressure 113/66, pulse 81, temperature 98.6 F (37 C), temperature source Oral, resp. rate (!) 22, height 5' (1.524 m), weight 79.4 kg, SpO2 97 %.    IV in place, occlusive dsg intact without redness.  Orientation to room, and floor completed with information packet given to patient/family. Patient declined safety video at this time.  Admission INP armband ID verified with patient. Call light within reach, patient able to voice, and demonstrate understanding.  Skin, clean-dry- intact without evidence of bruising, or skin tears.   No evidence of skin break down noted on exam.     Velva Harman, RN 09/07/2019 6:48 PM

## 2019-09-07 NOTE — Progress Notes (Addendum)
Inpatient Diabetes Program Recommendations  AACE/ADA: New Consensus Statement on Inpatient Glycemic Control (2015)  Target Ranges:  Prepandial:   less than 140 mg/dL      Peak postprandial:   less than 180 mg/dL (1-2 hours)      Critically ill patients:  140 - 180 mg/dL   Lab Results  Component Value Date   GLUCAP 377 (H) 09/07/2019   HGBA1C 12.0 (H) 09/07/2019    Review of Glycemic Control Results for Phyllis Hanson, Phyllis Hanson [facility] : [name] (MRN [id number]) as of 09/07/2019 13:45  Ref. Range 09/07/2019 04:09 09/07/2019 08:18 09/07/2019 13:12  Glucose-Capillary Latest Ref Range: 70 - 99 mg/dL 212 (H) 265 (H) 377 (H)   Diabetes history: DM 2 Outpatient Diabetes medications: None Has been on metformin and was prescribed insulin in 2019 Current orders for Inpatient glycemic control:  Lantus 15 units daily, Novolog moderate tid with meals and HS  Inpatient Diabetes Program Recommendations:    Spoke with patient by phone regarding DM history.  She was not taking any medications or checking blood sugars prior to admit.  Discussed current A1C.  She was prescribed insulin in 09/2017 (Lantus 10 units) however she was not clear whether she had prescription filled.  Discussed need for insulin with elevated A1C.  Patient was on insulin during pregnancy.  We reviewed normal blood sugar levels of 80-130 mg/dL fasting.  Also reviewed goal A1C of 7% (average 150 mg/dL).  Discussed importance of getting blood sugars under control as well. She is agreeable to taking insulin however notes high deductible insurance and seemed concerned about the price?  Will ask TOC to verify her co-pay for Lantus.  We also reviewed hypoglycemia signs, symptoms and treatment.  Patient was able to teach back.  Encouraged f/u with PCP ASAP after discharge.  Will order insulin pen starter kit and have RN assist patient in watching DM/insulin videos on patient education network.  Explained also that steroids used in hospital will also raise blood  sugar values.  Will follow.  -Consider adding Novolog meal coverage if post-prandial blood sugars remain elevated.  At d/c patient will need Rx. For insulin, insulin pen needles and blood glucose monitoring kit.    Thanks  Adah Perl, RN, BC-ADM Inpatient Diabetes Coordinator Pager [phone] (8a-5p)

## 2019-09-07 NOTE — ED Notes (Signed)
Tele   Breakfast ordered  

## 2019-09-08 LAB — COMPREHENSIVE METABOLIC PANEL
ALT: 19 U/L (ref 0–44)
AST: 23 U/L (ref 15–41)
Albumin: 2.9 g/dL — ABNORMAL LOW (ref 3.5–5.0)
Alkaline Phosphatase: 46 U/L (ref 38–126)
Anion gap: 11 (ref 5–15)
BUN: 10 mg/dL (ref 6–20)
CO2: 21 mmol/L — ABNORMAL LOW (ref 22–32)
Calcium: 8.4 mg/dL — ABNORMAL LOW (ref 8.9–10.3)
Chloride: 104 mmol/L (ref 98–111)
Creatinine, Ser: 0.57 mg/dL (ref 0.44–1.00)
GFR calc Af Amer: >60 mL/min (ref >60)
GFR calc non Af Amer: >60 mL/min (ref >60)
Glucose, Bld: 328 mg/dL — ABNORMAL HIGH (ref 70–99)
Potassium: 4.3 mmol/L (ref 3.5–5.1)
Sodium: 136 mmol/L (ref 135–145)
Total Bilirubin: 0.7 mg/dL (ref 0.3–1.2)
Total Protein: 7 g/dL (ref 6.5–8.1)

## 2019-09-08 LAB — CBC WITH DIFFERENTIAL/PLATELET
Abs Immature Granulocytes: 0.04 10*3/uL (ref 0.00–0.07)
Basophils Absolute: 0 10*3/uL (ref 0.0–0.1)
Basophils Relative: 0 %
Eosinophils Absolute: 0 10*3/uL (ref 0.0–0.5)
Eosinophils Relative: 0 %
HCT: 32.2 % — ABNORMAL LOW (ref 36.0–46.0)
Hemoglobin: 10.3 g/dL — ABNORMAL LOW (ref 12.0–15.0)
Immature Granulocytes: 1 %
Lymphocytes Relative: 17 %
Lymphs Abs: 1.1 10*3/uL (ref 0.7–4.0)
MCH: 23 pg — ABNORMAL LOW (ref 26.0–34.0)
MCHC: 32 g/dL (ref 30.0–36.0)
MCV: 72 fL — ABNORMAL LOW (ref 80.0–100.0)
Monocytes Absolute: 0.2 10*3/uL (ref 0.1–1.0)
Monocytes Relative: 3 %
Neutro Abs: 5.1 10*3/uL (ref 1.7–7.7)
Neutrophils Relative %: 79 %
Platelets: 324 10*3/uL (ref 150–400)
RBC: 4.47 MIL/uL (ref 3.87–5.11)
RDW: 14.1 % (ref 11.5–15.5)
WBC: 6.4 10*3/uL (ref 4.0–10.5)
nRBC: 0 % (ref 0.0–0.2)

## 2019-09-08 LAB — GLUCOSE, CAPILLARY
Glucose-Capillary: 355 mg/dL — ABNORMAL HIGH (ref 70–99)
Glucose-Capillary: 368 mg/dL — ABNORMAL HIGH (ref 70–99)
Glucose-Capillary: 289 mg/dL — ABNORMAL HIGH (ref 70–99)
Glucose-Capillary: 251 mg/dL — ABNORMAL HIGH (ref 70–99)

## 2019-09-08 LAB — C-REACTIVE PROTEIN: CRP: 9.6 mg/dL — ABNORMAL HIGH (ref <1.0)

## 2019-09-08 LAB — MAGNESIUM: Magnesium: 2.2 mg/dL (ref 1.7–2.4)

## 2019-09-08 LAB — D-DIMER, QUANTITATIVE: D-Dimer, Quant: 1.22 ug/mL-FEU — ABNORMAL HIGH (ref 0.00–0.50)

## 2019-09-08 LAB — PROCALCITONIN: Procalcitonin: <0.1 ng/mL

## 2019-09-08 MED ORDER — INSULIN ASPART 100 UNIT/ML ~~LOC~~ SOLN
4.0000 [IU] | Freq: Three times a day (TID) | SUBCUTANEOUS | Status: DC
  Administered 2019-09-08 – 2019-09-10 (×5): 4 [IU] via SUBCUTANEOUS

## 2019-09-08 MED ORDER — INSULIN GLARGINE 100 UNIT/ML ~~LOC~~ SOLN
25.0000 [IU] | Freq: Every day | SUBCUTANEOUS | Status: DC
  Administered 2019-09-09 – 2019-09-10 (×2): 25 [IU] via SUBCUTANEOUS
  Filled 2019-09-08 (×2): qty 0.25

## 2019-09-08 NOTE — Progress Notes (Signed)
Patient has been able to demonstrate proper self-administration of insulin

## 2019-09-08 NOTE — Plan of Care (Signed)
                                      [facility]                            [address]                            [place], [place] [address]      Phyllis Hanson was admitted to the Hospital on 09/06/2019 and Discharged  09/08/2019 and should be excused from work/school   for 14 days starting from date -  09/06/2019 , may return to work/school without any restrictions.  Call Susa Raring MD, [facility]  [phone] with questions.  Susa Raring M.D on 09/08/2019,at 10:51 AM  [facility]   Office Phone [phone] Office Fax - [phone]

## 2019-09-08 NOTE — Progress Notes (Signed)
PROGRESS NOTE                                                                                                                                                                                                             Patient Demographics:    Phyllis Hanson, is a 39 y.o. female, DOB - 1981-04-08, ZOX:[id number]  Outpatient Primary MD for the patient is Patient, No Pcp Per    LOS - 2  Admit date - 09/06/2019    Chief Complaint  Patient presents with  . COVID POS  . Shortness of Breath       Brief Narrative 39 year old female history of DM type II presented to the hospital with generalized abdominal pain and some shortness of breath, work-up suggestive of COVID-19 pneumonia, she had initially tested positive on March 9.  She was admitted to the hospital for further care.   Subjective:   Patient in bed, appears comfortable, denies any headache, no fever, no chest pain or pressure, no shortness of breath , no abdominal pain. No focal weakness.     Assessment  & Plan :     1. Acute Hypoxic Resp. Failure due to Acute Covid 19 Viral Pneumonitis during the ongoing 2020 Covid 19 Pandemic - she has mild to moderate disease, she has been started on steroids and IV remdesivir, continue to monitor closely.  Clinically seems to have stabilized.  CRP is trending down.  Will monitor closely.  In case her COVID-19 pneumonia gets worse she has consented for Actemra.  Encouraged the patient to sit up in chair in the daytime use I-S and flutter valve for pulmonary toiletry and then prone in bed when at night.  Will advance activity and titrate down oxygen as tolerated.  Actemra off label use - patient was told that if COVID-19 pneumonitis gets worse we might potentially use Actemra off label, patient denies any known history of tuberculosis or hepatitis, understands the risks and benefits and wants to proceed with Actemra treatment  if required.    SpO2: 91 % O2 Flow Rate (L/min): 1 L/min FiO2 (%): (!) 2 %  Recent Labs  Lab 09/07/19 0118 09/07/19 0430 09/08/19 0632  CRP 14.1* 11.2* 9.6*  DDIMER 1.14* 1.04* 1.22*  PROCALCITON <0.10  --  <0.10    Hepatic Function Latest Ref Rng & Units 09/08/2019 09/07/2019  09/06/2019  Total Protein 6.5 - 8.1 g/dL 7.0 6.2(L) 8.1  Albumin 3.5 - 5.0 g/dL 2.9(L) 2.7(L) 3.6  AST 15 - 41 U/L 23 27 27   ALT 0 - 44 U/L 19 15 19   Alk Phosphatase 38 - 126 U/L 46 39 51  Total Bilirubin 0.3 - 1.2 mg/dL 0.7 0.6 0.7    2.  DM type II - poor control outpatient due to hyperglycemia, Lantus and insulin dose adjusted further on 09/08/2019 for better control as sugars are running high due to paranoid exposure.  Lab Results  Component Value Date   HGBA1C 12.0 (H) 09/07/2019   CBG (last 3)  Recent Labs    09/07/19 1638 09/07/19 2121 09/08/19 0805  GLUCAP 316* 248* 355*      Condition - Fair   Family Communication  :  Wrong number listed in the chart  Code Status :  Full  Diet :   Diet Order            Diet Carb Modified Fluid consistency: Thin; Room service appropriate? Yes  Diet effective now               Disposition Plan  : Stay in the hospital and finish COVID-19 pneumonia treatment  Consults  :  None  Procedures  :  None  PUD Prophylaxis : None  DVT Prophylaxis  :  Lovenox    Lab Results  Component Value Date   PLT 324 09/08/2019    Inpatient Medications  Scheduled Meds: . dexamethasone (DECADRON) injection  6 mg Intravenous Q24H  . enoxaparin (LOVENOX) injection  40 mg Subcutaneous Q24H  . insulin aspart  0-15 Units Subcutaneous TID WC  . insulin aspart  0-5 Units Subcutaneous QHS  . insulin glargine  15 Units Subcutaneous Daily  . insulin starter kit- pen needles  1 kit Other Once  . living well with diabetes book   Does not apply Once  . sodium chloride flush  3 mL Intravenous Q12H   Continuous Infusions: . remdesivir 100 mg in NS 100 mL 100 mg  (09/08/19 1043)   PRN Meds:.acetaminophen, guaiFENesin-dextromethorphan, HYDROcodone-acetaminophen, [DISCONTINUED] ondansetron **OR** ondansetron (ZOFRAN) IV  Antibiotics  :    Anti-infectives (From admission, onward)   Start     Dose/Rate Route Frequency Ordered Stop   09/08/19 1000  remdesivir 100 mg in sodium chloride 0.9 % 100 mL IVPB  Status:  Discontinued     100 mg 200 mL/hr over 30 Minutes Intravenous Daily 09/07/19 0241 09/07/19 0304   09/08/19 1000  remdesivir 100 mg in sodium chloride 0.9 % 100 mL IVPB     100 mg 200 mL/hr over 30 Minutes Intravenous Daily 09/06/19 2356 09/12/19 0959   09/07/19 0245  remdesivir 200 mg in sodium chloride 0.9% 250 mL IVPB  Status:  Discontinued     200 mg 580 mL/hr over 30 Minutes Intravenous Once 09/07/19 0241 09/07/19 0304   09/07/19 0000  remdesivir 100 mg in sodium chloride 0.9 % 100 mL IVPB     100 mg 200 mL/hr over 30 Minutes Intravenous Every 30 min 09/06/19 2356 09/07/19 0234   09/06/19 2200  cefTRIAXone (ROCEPHIN) 1 g in sodium chloride 0.9 % 100 mL IVPB     1 g 200 mL/hr over 30 Minutes Intravenous  Once 09/06/19 2158 09/06/19 2332   09/06/19 2200  azithromycin (ZITHROMAX) 500 mg in sodium chloride 0.9 % 250 mL IVPB     500 mg 250 mL/hr over 60 Minutes Intravenous  Once 09/06/19 2158 09/07/19 0043       Time Spent in minutes  30   Lala Lund M.D on 09/08/2019 at 10:49 AM  To page go to [email or web address] - password [email or web address]  [facility] -  Office  [phone]    See all Orders from today for further details    Objective:   Vitals:   09/07/19 0944 09/07/19 2119 09/07/19 2150 09/08/19 0522  BP: 113/66 140/79  137/81  Pulse: 81 74  69  Resp: (!) 22 (!) 27 20 20   Temp: 98.6 F (37 C) 98.5 F (36.9 C)  97.6 F (36.4 C)  TempSrc: Oral Oral  Oral  SpO2: 97% 94%  91%  Weight:      Height:        Wt Readings from Last 3 Encounters:  09/07/19 79.4 kg     Intake/Output Summary (Last 24 hours) at 09/08/2019  1049 Last data filed at 09/07/2019 1700 Gross per 24 hour  Intake 768.02 ml  Output --  Net 768.02 ml     Physical Exam   Awake Alert, No new F.N deficits, Normal affect [place].AT,PERRAL Supple Neck,No JVD, No cervical lymphadenopathy appriciated.  Symmetrical Chest wall movement, Good air movement bilaterally, CTAB RRR,No Gallops, Rubs or new Murmurs, No Parasternal Heave +ve B.Sounds, Abd Soft, No tenderness, No organomegaly appriciated, No rebound - guarding or rigidity. No Cyanosis, Clubbing or edema, No new Rash or bruise     Data Review:    CBC Recent Labs  Lab 09/06/19 1848 09/07/19 0430 09/08/19 0632  WBC 7.6 6.0 6.4  HGB 11.7* 9.4* 10.3*  HCT 37.2 30.3* 32.2*  PLT 292 244 324  MCV 72.2* 73.7* 72.0*  MCH 22.7* 22.9* 23.0*  MCHC 31.5 31.0 32.0  RDW 13.7 13.9 14.1  LYMPHSABS 1.1 1.1 PENDING  MONOABS 0.3 0.3 PENDING  EOSABS 0.0 0.0 PENDING  BASOSABS 0.0 0.0 PENDING    Chemistries  Recent Labs  Lab 09/06/19 1848 09/07/19 0126 09/07/19 0430 09/08/19 0632  NA 134*  --  137 136  K 3.8  --  3.3* 4.3  CL 97*  --  104 104  CO2 23  --  19* 21*  GLUCOSE 233*  --  201* 328*  BUN 6  --  <5* 10  CREATININE 0.81  --  0.61 0.57  CALCIUM 9.0  --  7.4* 8.4*  AST 27  --  27 23  ALT 19  --  15 19  ALKPHOS 51  --  39 46  BILITOT 0.7  --  0.6 0.7  MG  --   --  1.5* 2.2  INR 1.1  --   --   --   HGBA1C  --  12.0*  --   --      ------------------------------------------------------------------------------------------------------------------ Recent Labs    09/07/19 0118  TRIG 146    Lab Results  Component Value Date   HGBA1C 12.0 (H) 09/07/2019   ------------------------------------------------------------------------------------------------------------------ No results for input(s): TSH, T4TOTAL, T3FREE, THYROIDAB in the last 72 hours.  Invalid input(s): FREET3  Cardiac Enzymes No results for input(s): CKMB, TROPONINI, MYOGLOBIN in the last 168  hours.  Invalid input(s): CK ------------------------------------------------------------------------------------------------------------------ No results found for: BNP  Micro Results Recent Results (from the past 240 hour(s))  Culture, blood (Routine x 2)     Status: None (Preliminary result)   Collection Time: 09/06/19  6:49 PM   Specimen: BLOOD  Result Value Ref Range Status   Specimen Description BLOOD LEFT ANTECUBITAL  Final  Special Requests   Final    BOTTLES DRAWN AEROBIC ONLY Blood Culture adequate volume   Culture   Final    NO GROWTH 2 DAYS Performed at [facility], [place] [address] [place], [place] [address]    Report Status PENDING  Incomplete  Culture, blood (Routine x 2)     Status: None (Preliminary result)   Collection Time: 09/07/19  1:24 AM   Specimen: BLOOD  Result Value Ref Range Status   Specimen Description BLOOD RIGHT ARM  Final   Special Requests   Final    BOTTLES DRAWN AEROBIC AND ANAEROBIC Blood Culture adequate volume   Culture   Final    NO GROWTH 1 DAY Performed at [facility], [place] [address], [place],  [address]    Report Status PENDING  Incomplete    Radiology Reports DG Chest Portable 1 View  Result Date: 09/06/2019 CLINICAL DATA:  Shortness of breath over the last day. EXAM: PORTABLE CHEST 1 VIEW COMPARISON:  None. FINDINGS: Heart size is normal. There are patchy bilateral pulmonary infiltrates. The pattern is suggestive of viral pneumonia. Bacterial pneumonia is possible. No evidence of dense consolidation or lobar collapse. No bone abnormality. IMPRESSION: Widespread bilateral patchy pulmonary infiltrates. Question coronavirus pneumonia. Electronically Signed   By: Nelson Chimes M.D.   On: 09/06/2019 19:58

## 2019-09-09 LAB — CBC WITH DIFFERENTIAL/PLATELET
Abs Immature Granulocytes: 0 10*3/uL (ref 0.00–0.07)
Basophils Absolute: 0 10*3/uL (ref 0.0–0.1)
Basophils Relative: 0 %
Eosinophils Absolute: 0 10*3/uL (ref 0.0–0.5)
Eosinophils Relative: 0 %
HCT: 30.4 % — ABNORMAL LOW (ref 36.0–46.0)
Hemoglobin: 9.6 g/dL — ABNORMAL LOW (ref 12.0–15.0)
Lymphocytes Relative: 4 %
Lymphs Abs: 0.4 10*3/uL — ABNORMAL LOW (ref 0.7–4.0)
MCH: 22.7 pg — ABNORMAL LOW (ref 26.0–34.0)
MCHC: 31.6 g/dL (ref 30.0–36.0)
MCV: 71.9 fL — ABNORMAL LOW (ref 80.0–100.0)
Monocytes Absolute: 0.8 10*3/uL (ref 0.1–1.0)
Monocytes Relative: 8 %
Neutro Abs: 9.2 10*3/uL — ABNORMAL HIGH (ref 1.7–7.7)
Neutrophils Relative %: 88 %
Platelets: 371 10*3/uL (ref 150–400)
RBC: 4.23 MIL/uL (ref 3.87–5.11)
RDW: 14.4 % (ref 11.5–15.5)
WBC: 10.4 10*3/uL (ref 4.0–10.5)
nRBC: 0 % (ref 0.0–0.2)
nRBC: 0 /100 WBC

## 2019-09-09 LAB — COMPREHENSIVE METABOLIC PANEL
ALT: 17 U/L (ref 0–44)
AST: 21 U/L (ref 15–41)
Albumin: 2.8 g/dL — ABNORMAL LOW (ref 3.5–5.0)
Alkaline Phosphatase: 43 U/L (ref 38–126)
Anion gap: 13 (ref 5–15)
BUN: 12 mg/dL (ref 6–20)
CO2: 22 mmol/L (ref 22–32)
Calcium: 8.7 mg/dL — ABNORMAL LOW (ref 8.9–10.3)
Chloride: 105 mmol/L (ref 98–111)
Creatinine, Ser: 0.76 mg/dL (ref 0.44–1.00)
GFR calc Af Amer: >60 mL/min (ref >60)
GFR calc non Af Amer: >60 mL/min (ref >60)
Glucose, Bld: 190 mg/dL — ABNORMAL HIGH (ref 70–99)
Potassium: 3.8 mmol/L (ref 3.5–5.1)
Sodium: 140 mmol/L (ref 135–145)
Total Bilirubin: 0.6 mg/dL (ref 0.3–1.2)
Total Protein: 6.4 g/dL — ABNORMAL LOW (ref 6.5–8.1)

## 2019-09-09 LAB — PROCALCITONIN: Procalcitonin: <0.1 ng/mL

## 2019-09-09 LAB — MAGNESIUM: Magnesium: 2.1 mg/dL (ref 1.7–2.4)

## 2019-09-09 LAB — GLUCOSE, CAPILLARY
Glucose-Capillary: 337 mg/dL — ABNORMAL HIGH (ref 70–99)
Glucose-Capillary: 411 mg/dL — ABNORMAL HIGH (ref 70–99)
Glucose-Capillary: 117 mg/dL — ABNORMAL HIGH (ref 70–99)
Glucose-Capillary: 153 mg/dL — ABNORMAL HIGH (ref 70–99)

## 2019-09-09 LAB — PATHOLOGIST SMEAR REVIEW

## 2019-09-09 LAB — C-REACTIVE PROTEIN: CRP: 4.7 mg/dL — ABNORMAL HIGH (ref <1.0)

## 2019-09-09 LAB — D-DIMER, QUANTITATIVE: D-Dimer, Quant: 0.76 ug/mL-FEU — ABNORMAL HIGH (ref 0.00–0.50)

## 2019-09-09 MED ORDER — DEXAMETHASONE SODIUM PHOSPHATE 4 MG/ML IJ SOLN
2.0000 mg | INTRAMUSCULAR | Status: DC
  Administered 2019-09-10: 2 mg via INTRAVENOUS
  Filled 2019-09-09: qty 1

## 2019-09-09 MED ORDER — INSULIN ASPART 100 UNIT/ML ~~LOC~~ SOLN
35.0000 [IU] | Freq: Once | SUBCUTANEOUS | Status: AC
  Administered 2019-09-09: 35 [IU] via SUBCUTANEOUS

## 2019-09-09 NOTE — Progress Notes (Signed)
PROGRESS NOTE                                                                                                                                                                                                             Patient Demographics:    Phyllis Hanson, is a 39 y.o. female, DOB - 05/27/1981, MHW:[id number]  Outpatient Primary MD for the patient is Patient, No Pcp Per    LOS - 3  Admit date - 09/06/2019    Chief Complaint  Patient presents with  . COVID POS  . Shortness of Breath       Brief Narrative 39 year old female history of DM type II presented to the hospital with generalized abdominal pain and some shortness of breath, work-up suggestive of COVID-19 pneumonia, she had initially tested positive on March 9.  She was admitted to the hospital for further care.   Subjective:   Patient in bed, appears comfortable, denies any headache, no fever, no chest pain or pressure, no shortness of breath , no abdominal pain. No focal weakness.     Assessment  & Plan :     1. Acute Hypoxic Resp. Failure due to Acute Covid 19 Viral Pneumonitis during the ongoing 2020 Covid 19 Pandemic - she has mild to moderate disease, she has been started on steroids and IV remdesivir, continue to monitor closely.  Until she has shown good improvement and CRP is trending down, she will finish her course of remdesivir and steroids on 09/10/2019 and thereafter if stable will be discharged home.  Encouraged the patient to sit up in chair in the daytime use I-S and flutter valve for pulmonary toiletry and then prone in bed when at night.  Will advance activity and titrate down oxygen as tolerated.     SpO2: 95 % O2 Flow Rate (L/min): 2 L/min FiO2 (%): (!) 2 %  Recent Labs  Lab 09/07/19 0118 09/07/19 0430 09/08/19 0632 09/09/19 0318  CRP 14.1* 11.2* 9.6* 4.7*  DDIMER 1.14* 1.04* 1.22* 0.76*  PROCALCITON <0.10  --  <0.10 <0.10      Hepatic Function Latest Ref Rng & Units 09/09/2019 09/08/2019 09/07/2019  Total Protein 6.5 - 8.1 g/dL 6.4(L) 7.0 6.2(L)  Albumin 3.5 - 5.0 g/dL 2.8(L) 2.9(L) 2.7(L)  AST 15 - 41 U/L 21 23 27   ALT 0 - 44 U/L  17 19 15   Alk Phosphatase 38 - 126 U/L 43 46 39  Total Bilirubin 0.3 - 1.2 mg/dL 0.6 0.7 0.6    2.  DM type II -extremely poor outpatient control due to hyperglycemia, due to steroids here sugars running high, steroid dose has been cut in half, insulin dose adjusted on 09/09/2019.  Patient has received insulin and diabetic education.  Lab Results  Component Value Date   HGBA1C 12.0 (H) 09/07/2019   CBG (last 3)  Recent Labs    09/08/19 1729 09/08/19 2126 09/09/19 0749  GLUCAP 289* 251* 337*      Condition - Fair   Family Communication  :  Wrong number listed in the chart  Code Status :  Full  Diet :   Diet Order            Diet Carb Modified Fluid consistency: Thin; Room service appropriate? Yes  Diet effective now               Disposition Plan  : Stay in the hospital and finish COVID-19 pneumonia treatment  Consults  :  None  Procedures  :  None  PUD Prophylaxis : None  DVT Prophylaxis  :  Lovenox    Lab Results  Component Value Date   PLT 371 09/09/2019    Inpatient Medications  Scheduled Meds: . [START ON 09/10/2019] dexamethasone (DECADRON) injection  2 mg Intravenous Q24H  . enoxaparin (LOVENOX) injection  40 mg Subcutaneous Q24H  . insulin aspart  0-15 Units Subcutaneous TID WC  . insulin aspart  0-5 Units Subcutaneous QHS  . insulin aspart  35 Units Subcutaneous Once  . insulin aspart  4 Units Subcutaneous TID WC  . insulin glargine  25 Units Subcutaneous Daily  . insulin starter kit- pen needles  1 kit Other Once  . living well with diabetes book   Does not apply Once  . sodium chloride flush  3 mL Intravenous Q12H   Continuous Infusions: . remdesivir 100 mg in NS 100 mL 100 mg (09/09/19 0815)   PRN Meds:.acetaminophen,  guaiFENesin-dextromethorphan, HYDROcodone-acetaminophen, [DISCONTINUED] ondansetron **OR** ondansetron (ZOFRAN) IV  Antibiotics  :    Anti-infectives (From admission, onward)   Start     Dose/Rate Route Frequency Ordered Stop   09/08/19 1000  remdesivir 100 mg in sodium chloride 0.9 % 100 mL IVPB  Status:  Discontinued     100 mg 200 mL/hr over 30 Minutes Intravenous Daily 09/07/19 0241 09/07/19 0304   09/08/19 1000  remdesivir 100 mg in sodium chloride 0.9 % 100 mL IVPB     100 mg 200 mL/hr over 30 Minutes Intravenous Daily 09/06/19 2356 09/12/19 0959   09/07/19 0245  remdesivir 200 mg in sodium chloride 0.9% 250 mL IVPB  Status:  Discontinued     200 mg 580 mL/hr over 30 Minutes Intravenous Once 09/07/19 0241 09/07/19 0304   09/07/19 0000  remdesivir 100 mg in sodium chloride 0.9 % 100 mL IVPB     100 mg 200 mL/hr over 30 Minutes Intravenous Every 30 min 09/06/19 2356 09/07/19 0234   09/06/19 2200  cefTRIAXone (ROCEPHIN) 1 g in sodium chloride 0.9 % 100 mL IVPB     1 g 200 mL/hr over 30 Minutes Intravenous  Once 09/06/19 2158 09/06/19 2332   09/06/19 2200  azithromycin (ZITHROMAX) 500 mg in sodium chloride 0.9 % 250 mL IVPB     500 mg 250 mL/hr over 60 Minutes Intravenous  Once 09/06/19 2158 09/07/19 0043  Time Spent in minutes  30   Lala Lund M.D on 09/09/2019 at 11:42 AM  To page go to [email or web address] - password [facility]  [facility] -  Office  [phone]    See all Orders from today for further details    Objective:   Vitals:   09/08/19 1700 09/08/19 2019 09/08/19 2340 09/09/19 0520  BP:  109/68 112/70 129/79  Pulse: 73 76 74 (!) 56  Resp: (!) 22 20 17 19   Temp:  98 F (36.7 C) 98.3 F (36.8 C) 97.8 F (36.6 C)  TempSrc:  Oral Oral Oral  SpO2: 91% 93% 92% 95%  Weight:      Height:        Wt Readings from Last 3 Encounters:  09/07/19 79.4 kg     Intake/Output Summary (Last 24 hours) at 09/09/2019 1142 Last data filed at 09/09/2019  1000 Gross per 24 hour  Intake 440 ml  Output --  Net 440 ml     Physical Exam  Awake Alert, No new F.N deficits, Normal affect [place].AT,PERRAL Supple Neck,No JVD, No cervical lymphadenopathy appriciated.  Symmetrical Chest wall movement, Good air movement bilaterally, CTAB RRR,No Gallops, Rubs or new Murmurs, No Parasternal Heave +ve B.Sounds, Abd Soft, No tenderness, No organomegaly appriciated, No rebound - guarding or rigidity. No Cyanosis, Clubbing or edema, No new Rash or bruise    Data Review:    CBC Recent Labs  Lab 09/06/19 1848 09/07/19 0430 09/08/19 0632 09/09/19 0318  WBC 7.6 6.0 6.4 10.4  HGB 11.7* 9.4* 10.3* 9.6*  HCT 37.2 30.3* 32.2* 30.4*  PLT 292 244 324 371  MCV 72.2* 73.7* 72.0* 71.9*  MCH 22.7* 22.9* 23.0* 22.7*  MCHC 31.5 31.0 32.0 31.6  RDW 13.7 13.9 14.1 14.4  LYMPHSABS 1.1 1.1 1.1 0.4*  MONOABS 0.3 0.3 0.2 0.8  EOSABS 0.0 0.0 0.0 0.0  BASOSABS 0.0 0.0 0.0 0.0    Chemistries  Recent Labs  Lab 09/06/19 1848 09/07/19 0126 09/07/19 0430 09/08/19 0632 09/09/19 0318  NA 134*  --  137 136 140  K 3.8  --  3.3* 4.3 3.8  CL 97*  --  104 104 105  CO2 23  --  19* 21* 22  GLUCOSE 233*  --  201* 328* 190*  BUN 6  --  <5* 10 12  CREATININE 0.81  --  0.61 0.57 0.76  CALCIUM 9.0  --  7.4* 8.4* 8.7*  AST 27  --  27 23 21   ALT 19  --  15 19 17   ALKPHOS 51  --  39 46 43  BILITOT 0.7  --  0.6 0.7 0.6  MG  --   --  1.5* 2.2 2.1  INR 1.1  --   --   --   --   HGBA1C  --  12.0*  --   --   --      ------------------------------------------------------------------------------------------------------------------ Recent Labs    09/07/19 0118  TRIG 146    Lab Results  Component Value Date   HGBA1C 12.0 (H) 09/07/2019   ------------------------------------------------------------------------------------------------------------------ No results for input(s): TSH, T4TOTAL, T3FREE, THYROIDAB in the last 72 hours.  Invalid input(s):  FREET3  Cardiac Enzymes No results for input(s): CKMB, TROPONINI, MYOGLOBIN in the last 168 hours.  Invalid input(s): CK ------------------------------------------------------------------------------------------------------------------ No results found for: BNP  Micro Results Recent Results (from the past 240 hour(s))  Culture, blood (Routine x 2)     Status: None (Preliminary result)   Collection Time: 09/06/19  6:49 PM   Specimen: BLOOD  Result Value Ref Range Status   Specimen Description BLOOD LEFT ANTECUBITAL  Final   Special Requests   Final    BOTTLES DRAWN AEROBIC ONLY Blood Culture adequate volume   Culture   Final    NO GROWTH 3 DAYS Performed at [facility], [address] [place], [place] [address]    Report Status PENDING  Incomplete  Culture, blood (Routine x 2)     Status: None (Preliminary result)   Collection Time: 09/07/19  1:24 AM   Specimen: BLOOD  Result Value Ref Range Status   Specimen Description BLOOD RIGHT ARM  Final   Special Requests   Final    BOTTLES DRAWN AEROBIC AND ANAEROBIC Blood Culture adequate volume   Culture   Final    NO GROWTH 2 DAYS Performed at [facility], [place] [address] [place], [place] [address]    Report Status PENDING  Incomplete    Radiology Reports DG Chest Portable 1 View  Result Date: 09/06/2019 CLINICAL DATA:  Shortness of breath over the last day. EXAM: PORTABLE CHEST 1 VIEW COMPARISON:  None. FINDINGS: Heart size is normal. There are patchy bilateral pulmonary infiltrates. The pattern is suggestive of viral pneumonia. Bacterial pneumonia is possible. No evidence of dense consolidation or lobar collapse. No bone abnormality. IMPRESSION: Widespread bilateral patchy pulmonary infiltrates. Question coronavirus pneumonia. Electronically Signed   By: Nelson Chimes M.D.   On: 09/06/2019 19:58

## 2019-09-09 NOTE — Plan of Care (Signed)
  Problem: Education: Goal: Knowledge of General Education information will improve Description: Including pain rating scale, medication(s)/side effects and non-pharmacologic comfort measures Outcome: Progressing   Problem: Clinical Measurements: Goal: Will remain free from infection Outcome: Progressing   

## 2019-09-09 NOTE — TOC Benefit Eligibility Note (Signed)
Transition of Care [facility]) Benefit Eligibility Note    Patient Details  Name: Phyllis Hanson MRN: [id number] Date of Birth: 02-17-1981   Medication/Dose: Lantus and Levemir (pens and vials)  Covered?: Yes  Spoke with Person/Company/Phone Number:: [employer]  Co-Pay: $0  Prior Approval: No   Additional Notes: both insulins are covered at 100%    Orson Aloe Phone Number: 09/09/2019, 2:44 PM

## 2019-09-09 NOTE — Progress Notes (Signed)
MD notified of blood sugar of 411. Orders to given 35 units of novalog now, disregard SS insulin and Scheduled 4 units at this time and only give 35 units total.

## 2019-09-09 NOTE — Progress Notes (Signed)
Inpatient Diabetes Program Recommendations  AACE/ADA: New Consensus Statement on Inpatient Glycemic Control (2015)  Target Ranges:  Prepandial:   less than 140 mg/dL      Peak postprandial:   less than 180 mg/dL (1-2 hours)      Critically ill patients:  140 - 180 mg/dL   Lab Results  Component Value Date   GLUCAP 411 (H) 09/09/2019   HGBA1C 12.0 (H) 09/07/2019    Review of Glycemic Control Results for [place] (MRN [id number]) as of 09/09/2019 15:45  Ref. Range 09/08/2019 12:30 09/08/2019 17:29 09/08/2019 21:26 09/09/2019 07:49 09/09/2019 11:37  Glucose-Capillary Latest Ref Range: 70 - 99 mg/dL 733 (H) 448 (H) 301 (H) 337 (H) 411 (H)   Diabetes history: DM2 Outpatient Diabetes medications: None Current orders for Inpatient glycemic control: novolog 0-15 TID + 0-5 QHS + novolog 4 units TID with meals + Lantus 25 units QD + decreased to 2 mg daily  Inpatient Diabetes Program Recommendations:     Noted Lantus was increased today to 25 units daily from 15 units.  Please consider, Novolog 6 units tid with meals if eats at least 50% of meal   Thank you, Dulce Sellar, RN, BSN Diabetes Coordinator Inpatient Diabetes Program [phone] (team pager from 8a-5p)

## 2019-09-09 NOTE — Care Management (Signed)
Benefit check submitted on on lantus or levemir

## 2019-09-10 LAB — CBC WITH DIFFERENTIAL/PLATELET
Abs Immature Granulocytes: 0.44 10*3/uL — ABNORMAL HIGH (ref 0.00–0.07)
Basophils Absolute: 0 10*3/uL (ref 0.0–0.1)
Basophils Relative: 0 %
Eosinophils Absolute: 0 10*3/uL (ref 0.0–0.5)
Eosinophils Relative: 0 %
HCT: 30.1 % — ABNORMAL LOW (ref 36.0–46.0)
Hemoglobin: 9.5 g/dL — ABNORMAL LOW (ref 12.0–15.0)
Immature Granulocytes: 4 %
Lymphocytes Relative: 16 %
Lymphs Abs: 1.9 10*3/uL (ref 0.7–4.0)
MCH: 22.6 pg — ABNORMAL LOW (ref 26.0–34.0)
MCHC: 31.6 g/dL (ref 30.0–36.0)
MCV: 71.5 fL — ABNORMAL LOW (ref 80.0–100.0)
Monocytes Absolute: 0.6 10*3/uL (ref 0.1–1.0)
Monocytes Relative: 5 %
Neutro Abs: 8.8 10*3/uL — ABNORMAL HIGH (ref 1.7–7.7)
Neutrophils Relative %: 75 %
Platelets: 371 10*3/uL (ref 150–400)
RBC: 4.21 MIL/uL (ref 3.87–5.11)
RDW: 14.5 % (ref 11.5–15.5)
WBC: 12.1 10*3/uL — ABNORMAL HIGH (ref 4.0–10.5)
nRBC: 0.2 % (ref 0.0–0.2)

## 2019-09-10 LAB — COMPREHENSIVE METABOLIC PANEL
ALT: 17 U/L (ref 0–44)
AST: 18 U/L (ref 15–41)
Albumin: 2.8 g/dL — ABNORMAL LOW (ref 3.5–5.0)
Alkaline Phosphatase: 44 U/L (ref 38–126)
Anion gap: 12 (ref 5–15)
BUN: 15 mg/dL (ref 6–20)
CO2: 23 mmol/L (ref 22–32)
Calcium: 8.6 mg/dL — ABNORMAL LOW (ref 8.9–10.3)
Chloride: 105 mmol/L (ref 98–111)
Creatinine, Ser: 0.74 mg/dL (ref 0.44–1.00)
GFR calc Af Amer: >60 mL/min (ref >60)
GFR calc non Af Amer: >60 mL/min (ref >60)
Glucose, Bld: 122 mg/dL — ABNORMAL HIGH (ref 70–99)
Potassium: 4 mmol/L (ref 3.5–5.1)
Sodium: 140 mmol/L (ref 135–145)
Total Bilirubin: 0.6 mg/dL (ref 0.3–1.2)
Total Protein: 6.1 g/dL — ABNORMAL LOW (ref 6.5–8.1)

## 2019-09-10 LAB — GLUCOSE, CAPILLARY: Glucose-Capillary: 267 mg/dL — ABNORMAL HIGH (ref 70–99)

## 2019-09-10 LAB — MAGNESIUM: Magnesium: 2.1 mg/dL (ref 1.7–2.4)

## 2019-09-10 LAB — D-DIMER, QUANTITATIVE: D-Dimer, Quant: 1.29 ug/mL-FEU — ABNORMAL HIGH (ref 0.00–0.50)

## 2019-09-10 LAB — C-REACTIVE PROTEIN: CRP: 2.6 mg/dL — ABNORMAL HIGH (ref <1.0)

## 2019-09-10 MED ORDER — "INSULIN SYRINGE-NEEDLE U-100 25G X 1"" 1 ML MISC": 0 refills | Status: AC

## 2019-09-10 MED ORDER — INSULIN GLARGINE 100 UNIT/ML ~~LOC~~ SOLN: 20.0000 [IU] | Freq: Every day | SUBCUTANEOUS | 0 refills | Status: AC

## 2019-09-10 MED ORDER — BLOOD GLUCOSE MONITOR KIT: PACK | 1 refills | Status: AC

## 2019-09-10 MED ORDER — INSULIN ASPART 100 UNIT/ML ~~LOC~~ SOLN: SUBCUTANEOUS | 0 refills | Status: AC

## 2019-09-10 MED ORDER — METFORMIN HCL 500 MG PO TABS: 500.0000 mg | ORAL_TABLET | Freq: Two times a day (BID) | ORAL | 0 refills | Status: AC

## 2019-09-10 NOTE — Progress Notes (Signed)
                                      [facility]                            [address]                            [place], [place] [address]      Phyllis Hanson was admitted to the Hospital on 09/06/2019 and Discharged  09/10/2019 and should be excused from work/school   for 21  days starting from date -  09/06/2019 , may return to work/school without any restrictions.  Call Susa Raring MD, [facility]  [phone] with questions.  Susa Raring M.D on 09/10/2019,at 7:20 AM  [facility]   Office  [phone]

## 2019-09-10 NOTE — Discharge Instructions (Addendum)
Follow with Primary MD  in 7 days   Get CBC, CMP, 2 view Chest X ray -  checked next visit within 1 week by Primary MD   Activity: As tolerated with Full fall precautions use walker/cane & assistance as needed  Disposition Home   Diet: Heart Healthy - Low Carb  Accuchecks 4 times/day, Once in AM empty stomach and then before each meal. Log in all results and show them to your Prim.MD in 3 days. If any glucose reading is under 80 or above 300 call your Prim MD immidiately. Follow Low glucose instructions for glucose under 80 as instructed.  Special Instructions: If you have smoked or chewed Tobacco  in the last 2 yrs please stop smoking, stop any regular Alcohol  and or any Recreational drug use.  On your next visit with your primary care physician please Get Medicines reviewed and adjusted.  Please request your Prim.MD to go over all Hospital Tests and Procedure/Radiological results at the follow up, please get all Hospital records sent to your Prim MD by signing hospital release before you go home.  If you experience worsening of your admission symptoms, develop shortness of breath, life threatening emergency, suicidal or homicidal thoughts you must seek medical attention immediately by calling 911 or calling your MD immediately  if symptoms less severe.  You Must read complete instructions/literature along with all the possible adverse reactions/side effects for all the Medicines you take and that have been prescribed to you. Take any new Medicines after you have completely understood and accpet all the possible adverse reactions/side effects.       Person Under Monitoring Name: Phyllis Hanson  Location: [place] [address]   Infection Prevention Recommendations for Individuals Confirmed to have, or Being Evaluated for, 2019 Novel Coronavirus (COVID-19) Infection Who Receive Care at Home  Individuals who are confirmed to have, or are being evaluated  for, COVID-19 should follow the prevention steps below until a healthcare provider or local or state health department says they can return to normal activities.  Stay home except to get medical care You should restrict activities outside your home, except for getting medical care. Do not go to work, school, or public areas, and do not use public transportation or taxis.  Call ahead before visiting your doctor Before your medical appointment, call the healthcare provider and tell them that you have, or are being evaluated for, COVID-19 infection. This will help the healthcare provider's office take steps to keep other people from getting infected. Ask your healthcare provider to call the local or state health department.  Monitor your symptoms Seek prompt medical attention if your illness is worsening (e.g., difficulty breathing). Before going to your medical appointment, call the healthcare provider and tell them that you have, or are being evaluated for, COVID-19 infection. Ask your healthcare provider to call the local or state health department.  Wear a facemask You should wear a facemask that covers your nose and mouth when you are in the same room with other people and when you visit a healthcare provider. People who live with or visit you should also wear a facemask while they are in the same room with you.  Separate yourself from other people in your home As much as possible, you should stay in a different room from other people in your home. Also, you should use a separate bathroom, if available.  Avoid sharing household items You should not share dishes, drinking glasses, cups, eating  utensils, towels, bedding, or other items with other people in your home. After using these items, you should wash them thoroughly with soap and water.  Cover your coughs and sneezes Cover your mouth and nose with a tissue when you cough or sneeze, or you can cough or sneeze into your sleeve.  Throw used tissues in a lined trash can, and immediately wash your hands with soap and water for at least 20 seconds or use an alcohol-based hand rub.  Wash your [employer] your hands often and thoroughly with soap and water for at least 20 seconds. You can use an alcohol-based hand sanitizer if soap and water are not available and if your hands are not visibly dirty. Avoid touching your eyes, nose, and mouth with unwashed hands.   Prevention Steps for Caregivers and Household Members of Individuals Confirmed to have, or Being Evaluated for, COVID-19 Infection Being Cared for in the Home  If you live with, or provide care at home for, a person confirmed to have, or being evaluated for, COVID-19 infection please follow these guidelines to prevent infection:  Follow healthcare provider's instructions Make sure that you understand and can help the patient follow any healthcare provider instructions for all care.  Provide for the patient's basic needs You should help the patient with basic needs in the home and provide support for getting groceries, prescriptions, and other personal needs.  Monitor the patient's symptoms If they are getting sicker, call his or her medical provider and tell them that the patient has, or is being evaluated for, COVID-19 infection. This will help the healthcare provider's office take steps to keep other people from getting infected. Ask the healthcare provider to call the local or state health department.  Limit the number of people who have contact with the patient  If possible, have only one caregiver for the patient.  Other household members should stay in another home or place of residence. If this is not possible, they should stay  in another room, or be separated from the patient as much as possible. Use a separate bathroom, if available.  Restrict visitors who do not have an essential need to be in the home.  Keep older adults, very young  children, and other sick people away from the patient Keep older adults, very young children, and those who have compromised immune systems or chronic health conditions away from the patient. This includes people with chronic heart, lung, or kidney conditions, diabetes, and cancer.  Ensure good ventilation Make sure that shared spaces in the home have good air flow, such as from an air conditioner or an opened window, weather permitting.  Wash your hands often  Wash your hands often and thoroughly with soap and water for at least 20 seconds. You can use an alcohol based hand sanitizer if soap and water are not available and if your hands are not visibly dirty.  Avoid touching your eyes, nose, and mouth with unwashed hands.  Use disposable paper towels to dry your hands. If not available, use dedicated cloth towels and replace them when they become wet.  Wear a facemask and gloves  Wear a disposable facemask at all times in the room and gloves when you touch or have contact with the patient's blood, body fluids, and/or secretions or excretions, such as sweat, saliva, sputum, nasal mucus, vomit, urine, or feces.  Ensure the mask fits over your nose and mouth tightly, and do not touch it during use.  Throw out disposable  facemasks and gloves after using them. Do not reuse.  Wash your hands immediately after removing your facemask and gloves.  If your personal clothing becomes contaminated, carefully remove clothing and launder. Wash your hands after handling contaminated clothing.  Place all used disposable facemasks, gloves, and other waste in a lined container before disposing them with other household waste.  Remove gloves and wash your hands immediately after handling these items.  Do not share dishes, glasses, or other household items with the patient  Avoid sharing household items. You should not share dishes, drinking glasses, cups, eating utensils, towels, bedding, or other items  with a patient who is confirmed to have, or being evaluated for, COVID-19 infection.  After the person uses these items, you should wash them thoroughly with soap and water.  Wash laundry thoroughly  Immediately remove and wash clothes or bedding that have blood, body fluids, and/or secretions or excretions, such as sweat, saliva, sputum, nasal mucus, vomit, urine, or feces, on them.  Wear gloves when handling laundry from the patient.  Read and follow directions on labels of laundry or clothing items and detergent. In general, wash and dry with the warmest temperatures recommended on the label.  Clean all areas the individual has used often  Clean all touchable surfaces, such as counters, tabletops, doorknobs, bathroom fixtures, toilets, phones, keyboards, tablets, and bedside tables, every day. Also, clean any surfaces that may have blood, body fluids, and/or secretions or excretions on them.  Wear gloves when cleaning surfaces the patient has come in contact with.  Use a diluted bleach solution (e.g., dilute bleach with 1 part bleach and 10 parts water) or a household disinfectant with a label that says EPA-registered for coronaviruses. To make a bleach solution at home, add 1 tablespoon of bleach to 1 quart (4 cups) of water. For a larger supply, add  cup of bleach to 1 gallon (16 cups) of water.  Read labels of cleaning products and follow recommendations provided on product labels. Labels contain instructions for safe and effective use of the cleaning product including precautions you should take when applying the product, such as wearing gloves or eye protection and making sure you have good ventilation during use of the product.  Remove gloves and wash hands immediately after cleaning.  Monitor yourself for signs and symptoms of illness Caregivers and household members are considered close contacts, should monitor their health, and will be asked to limit movement outside of the  home to the extent possible. Follow the monitoring steps for close contacts listed on the symptom monitoring form.   ? If you have additional questions, contact your local health department or call the epidemiologist on call at [phone] (available 24/7). ? This guidance is subject to change. For the most up-to-date guidance from [facility], please refer to their website: [email or web address]

## 2019-09-10 NOTE — Progress Notes (Signed)
Patient scheduled for outpatient Remdesivir infusion at 8:30 AM on Sunday 3/21. Marland Kitchen  Please advise them to report to [facility] at [address]  Drive to the security guard and tell them you are here for an infusion. They will direct you to the front entrance where we will come and get you.  For questions call [phone].  Thanks

## 2019-09-10 NOTE — Discharge Summary (Addendum)
Phyllis Hanson GBT:[id number] DOB: 1980-10-26 DOA: 09/06/2019  PCP: Patient, No Pcp Per  Admit date: 09/06/2019  Discharge date: 09/10/2019  Admitted From: Home   Disposition:  Home   Recommendations for Outpatient Follow-up:   Follow up with PCP in 1-2 weeks  PCP Please obtain BMP/CBC, 2 view CXR in 1week,  (see Discharge instructions)   PCP Please follow up on the following pending results: Monitor CBGs closely   Home Health: None   Equipment/Devices: None  Consultations: None  Discharge Condition: Stable    CODE STATUS: Full    Diet Recommendation: Heart Healthy Low Carb  Diet Order            Diet Carb Modified Fluid consistency: Thin; Room service appropriate? Yes  Diet effective now               Chief Complaint  Patient presents with  . COVID POS  . Shortness of Breath     Brief history of present illness from the day of admission and additional interim summary    39 year old female history of DM type II presented to the hospital with generalized abdominal pain and some shortness of breath, work-up suggestive of COVID-19 pneumonia, she had initially tested positive on March 9.  She was admitted to the hospital for further care.                                                                  Hospital Course   1. Acute Hypoxic Resp. Failure due to Acute Covid 19 Viral Pneumonitis during the ongoing 2020 Covid 19 Pandemic - she has mild to moderate disease, he was treated with a course of steroids and remdesivir, she has finished her steroid course today and she will get her last dose of remdesivir in the outpatient infusion clinic on 09/11/2019, her CRP has trended down, clinically she is much better and stable on room air.  Will be discharged home with outpatient PCP  follow-up.Marland Kitchen  Recent Labs  Lab 09/07/19 0118 09/07/19 0430 09/08/19 0632 09/09/19 0318 09/10/19 0347  CRP 14.1* 11.2* 9.6* 4.7* 2.6*  DDIMER 1.14* 1.04* 1.22* 0.76* 1.29*  FERRITIN 390* 306  --   --   --   PROCALCITON <0.10  --  <0.10 <0.10  --     Hepatic Function Latest Ref Rng & Units 09/10/2019 09/09/2019 09/08/2019  Total Protein 6.5 - 8.1 g/dL 6.1(L) 6.4(L) 7.0  Albumin 3.5 - 5.0 g/dL 2.8(L) 2.8(L) 2.9(L)  AST 15 - 41 U/L 18 21 23   ALT 0 - 44 U/L 17 17 19   Alk Phosphatase 38 - 126 U/L 44 43 46  Total Bilirubin 0.3 - 1.2 mg/dL 0.6 0.6 0.7    2.  DM type II - ? New diagnosis, extremely poor outpatient control due to  hyperglycemia, received diabetic and insulin education here, placed on Glucophage along with Lantus and sliding scale, testing supplies provided.  PCP to monitor glycemic control in the outpatient setting closely.   Discharge diagnosis     Active Problems:   Pneumonia due to COVID-19 virus   Acute respiratory failure due to COVID-19 ([place])   DM2 (diabetes mellitus, type 2) [facility])    Discharge instructions    Discharge Instructions    Discharge instructions   Complete by: As directed    Follow with Primary MD  in 7 days   Get CBC, CMP, 2 view Chest X ray -  checked next visit within 1 week by Primary MD   Activity: As tolerated with Full fall precautions use walker/cane & assistance as needed  Disposition Home   Diet: Heart Healthy - Low Carb  Accuchecks 4 times/day, Once in AM empty stomach and then before each meal. Log in all results and show them to your Prim.MD in 3 days. If any glucose reading is under 80 or above 300 call your Prim MD immidiately. Follow Low glucose instructions for glucose under 80 as instructed.  Special Instructions: If you have smoked or chewed Tobacco  in the last 2 yrs please stop smoking, stop any regular Alcohol  and or any Recreational drug use.  On your next visit with your primary care physician please Get  Medicines reviewed and adjusted.  Please request your Prim.MD to go over all Hospital Tests and Procedure/Radiological results at the follow up, please get all Hospital records sent to your Prim MD by signing hospital release before you go home.  If you experience worsening of your admission symptoms, develop shortness of breath, life threatening emergency, suicidal or homicidal thoughts you must seek medical attention immediately by calling 911 or calling your MD immediately  if symptoms less severe.  You Must read complete instructions/literature along with all the possible adverse reactions/side effects for all the Medicines you take and that have been prescribed to you. Take any new Medicines after you have completely understood and accpet all the possible adverse reactions/side effects.   Increase activity slowly   Complete by: As directed    MyChart COVID-19 home monitoring program   Complete by: Sep 10, 2019    Is the patient willing to use the [place] for home monitoring?: Yes   Temperature monitoring   Complete by: Sep 10, 2019    After how many days would you like to receive a notification of this patient's flowsheet entries?: 1      Discharge Medications   Allergies as of 09/10/2019      Reactions   Shellfish Allergy Swelling, Hives      Medication List    TAKE these medications   acetaminophen 500 MG tablet Commonly known as: TYLENOL Take 1,000 mg by mouth every 6 (six) hours as needed for moderate pain or fever.   blood glucose meter kit and supplies Kit Dispense based on patient and insurance preference. Use up to four times daily as directed. (FOR ICD-9 250.00, 250.01). For QAC - HS accuchecks.   FISH OIL PO Take 1 capsule by mouth daily.   insulin aspart 100 UNIT/ML injection Commonly known as: NovoLOG Substitute to any brand approved.Before each meal 3 times a day, 140-199 - 2 units, 200-250 - 4 units, 251-299 - 6 units,  300-349 - 8 units,  350 or  above 10 units. Dispense syringes and needles as needed, Ok to switch to [employer]  if approved. DX DM2, Code E11.65   insulin glargine 100 UNIT/ML injection Commonly known as: Lantus Inject 0.2 mLs (20 Units total) into the skin at bedtime. Dispense insulin pen if approved, if not dispense as needed syringes and needles for 1 month supply. Can switch to Levemir. Diagnosis E 11.65.   Insulin Syringe-Needle U-100 25G X 1" 1 ML Misc For 4 times a day insulin SQ, 1 month supply. Diagnosis E11.65   MAGNESIUM PO Take 1 tablet by mouth daily.   metFORMIN 500 MG tablet Commonly known as: Glucophage Take 1 tablet (500 mg total) by mouth 2 (two) times daily with a meal.   VITAMIN C PO Take 1 tablet by mouth daily.   ZINC PO Take 1 tablet by mouth daily.       Follow-up [place]. Schedule an appointment as soon as possible for a visit in 1 week(s).   Specialty: Internal Medicine Contact information: [address] [id number] [place] [phone]          Major procedures and Radiology Reports - PLEASE review detailed and final reports thoroughly  -      DG Chest Portable 1 View  Result Date: 09/06/2019 CLINICAL DATA:  Shortness of breath over the last day. EXAM: PORTABLE CHEST 1 VIEW COMPARISON:  None. FINDINGS: Heart size is normal. There are patchy bilateral pulmonary infiltrates. The pattern is suggestive of viral pneumonia. Bacterial pneumonia is possible. No evidence of dense consolidation or lobar collapse. No bone abnormality. IMPRESSION: Widespread bilateral patchy pulmonary infiltrates. Question coronavirus pneumonia. Electronically Signed   By: Nelson Chimes M.D.   On: 09/06/2019 19:58    Micro Results    Recent Results (from the past 240 hour(s))  Culture, blood (Routine x 2)     Status: None (Preliminary result)   Collection Time: 09/06/19  6:49 PM   Specimen: BLOOD  Result Value Ref Range  Status   Specimen Description BLOOD LEFT ANTECUBITAL  Final   Special Requests   Final    BOTTLES DRAWN AEROBIC ONLY Blood Culture adequate volume   Culture   Final    NO GROWTH 4 DAYS Performed at [facility], [address] [place], [place] [address]    Report Status PENDING  Incomplete  Culture, blood (Routine x 2)     Status: None (Preliminary result)   Collection Time: 09/07/19  1:24 AM   Specimen: BLOOD  Result Value Ref Range Status   Specimen Description BLOOD RIGHT ARM  Final   Special Requests   Final    BOTTLES DRAWN AEROBIC AND ANAEROBIC Blood Culture adequate volume   Culture   Final    NO GROWTH 3 DAYS Performed at [facility], [place] [address], [place], [place] [address]    Report Status PENDING  Incomplete    Today   Subjective    Phyllis Hanson today has no headache,no chest abdominal pain,no new weakness tingling or numbness, feels much better wants to go home today.    Objective   Blood pressure 137/77, pulse (!) 58, temperature 97.6 F (36.4 C), temperature source Axillary, resp. rate 18, height 5' (1.524 m), weight 79.4 kg, SpO2 93 %.   Intake/Output Summary (Last 24 hours) at 09/10/2019 1034 Last data filed at 09/09/2019 2000 Gross per 24 hour  Intake 240 ml  Output --  Net 240 ml    Exam Awake Alert, Oriented x 3, No new  F.N deficits, Normal affect [place].AT,PERRAL Supple Neck,No JVD, No cervical lymphadenopathy appriciated.  Symmetrical Chest wall movement, Good air movement bilaterally, CTAB RRR,No Gallops,Rubs or new Murmurs, No Parasternal Heave +ve B.Sounds, Abd Soft, Non tender, No organomegaly appriciated, No rebound -guarding or rigidity. No Cyanosis, Clubbing or edema, No new Rash or bruise   Data Review   CBC w Diff:  Lab Results  Component Value Date   WBC 12.1 (H) 09/10/2019   HGB 9.5 (L) 09/10/2019   HCT 30.1 (L) 09/10/2019   PLT 371 09/10/2019   LYMPHOPCT 16 09/10/2019   MONOPCT 5 09/10/2019   EOSPCT  0 09/10/2019   BASOPCT 0 09/10/2019    CMP:  Lab Results  Component Value Date   NA 140 09/10/2019   K 4.0 09/10/2019   CL 105 09/10/2019   CO2 23 09/10/2019   BUN 15 09/10/2019   CREATININE 0.74 09/10/2019   PROT 6.1 (L) 09/10/2019   ALBUMIN 2.8 (L) 09/10/2019   BILITOT 0.6 09/10/2019   ALKPHOS 44 09/10/2019   AST 18 09/10/2019   ALT 17 09/10/2019  .   Total Time in preparing paper work, data evaluation and todays exam - 110 minutes  Lala Lund M.D on 09/10/2019 at 10:34 AM  [facility]   Office  [phone]

## 2019-09-11 ENCOUNTER — Ambulatory Visit (HOSPITAL_COMMUNITY)
Admission: RE | Admit: 2019-09-11 | Discharge: 2019-09-11 | Disposition: A | Discharge status: Home / Self Care | Payer: BC Managed Care – PPO | Source: Ambulatory Visit | Attending: Pulmonary Disease | Admitting: Pulmonary Disease

## 2019-09-11 DIAGNOSIS — J1282 Pneumonia due to coronavirus disease 2019: Secondary | ICD-10-CM | POA: Insufficient documentation

## 2019-09-11 DIAGNOSIS — U071 COVID-19: Secondary | ICD-10-CM | POA: Insufficient documentation

## 2019-09-11 LAB — CULTURE, BLOOD (ROUTINE X 2)
Culture: NO GROWTH
Special Requests: ADEQUATE

## 2019-09-11 MED ORDER — EPINEPHRINE 0.3 MG/0.3ML IJ SOAJ: 0.3000 mg | Freq: Once | INTRAMUSCULAR | Status: DC | PRN

## 2019-09-11 MED ORDER — SODIUM CHLORIDE 0.9 % IV SOLN: INTRAVENOUS | Status: DC | PRN

## 2019-09-11 MED ORDER — ALBUTEROL SULFATE HFA 108 (90 BASE) MCG/ACT IN AERS: 2.0000 | INHALATION_SPRAY | Freq: Once | RESPIRATORY_TRACT | Status: DC | PRN

## 2019-09-11 MED ORDER — SODIUM CHLORIDE 0.9 % IV SOLN
100.0000 mg | Freq: Once | INTRAVENOUS | Status: AC
  Administered 2019-09-11: 100 mg via INTRAVENOUS
  Filled 2019-09-11: qty 20

## 2019-09-11 MED ORDER — METHYLPREDNISOLONE SODIUM SUCC 125 MG IJ SOLR: 125.0000 mg | Freq: Once | INTRAMUSCULAR | Status: DC | PRN

## 2019-09-11 MED ORDER — DIPHENHYDRAMINE HCL 50 MG/ML IJ SOLN: 50.0000 mg | Freq: Once | INTRAMUSCULAR | Status: DC | PRN

## 2019-09-11 MED ORDER — FAMOTIDINE IN NACL 20-0.9 MG/50ML-% IV SOLN: 20.0000 mg | Freq: Once | INTRAVENOUS | Status: DC | PRN

## 2019-09-11 NOTE — Progress Notes (Signed)
  Diagnosis: COVID-19  Physician:dr wright   Procedure: Covid Infusion Clinic Med: remdesivir infusion.  Complications: No immediate complications noted.  Discharge: Discharged home   Phyllis Hanson 09/11/2019

## 2019-09-11 NOTE — Discharge Instructions (Signed)
COVID-19 COVID-19 is a respiratory infection that is caused by a virus called severe acute respiratory syndrome coronavirus 2 (SARS-CoV-2). The disease is also known as coronavirus disease or novel coronavirus. In some people, the virus may not cause any symptoms. In others, it may cause a serious infection. The infection can get worse quickly and can lead to complications, such as:  Pneumonia, or infection of the lungs.  Acute respiratory distress syndrome or ARDS. This is a condition in which fluid build-up in the lungs prevents the lungs from filling with air and passing oxygen into the blood.  Acute respiratory failure. This is a condition in which there is not enough oxygen passing from the lungs to the body or when carbon dioxide is not passing from the lungs out of the body.  Sepsis or septic shock. This is a serious bodily reaction to an infection.  Blood clotting problems.  Secondary infections due to bacteria or fungus.  Organ failure. This is when your body's organs stop working. The virus that causes COVID-19 is contagious. This means that it can spread from person to person through droplets from coughs and sneezes (respiratory secretions). What are the causes? This illness is caused by a virus. You may catch the virus by:  Breathing in droplets from an infected person. Droplets can be spread by a person breathing, speaking, singing, coughing, or sneezing.  Touching something, like a table or a doorknob, that was exposed to the virus (contaminated) and then touching your mouth, nose, or eyes. What increases the risk? Risk for infection You are more likely to be infected with this virus if you:  Are within 6 feet (2 meters) of a person with COVID-19.  Provide care for or live with a person who is infected with COVID-19.  Spend time in crowded indoor spaces or live in shared housing. Risk for serious illness You are more likely to become seriously ill from the virus if you:   Are 50 years of age or older. The higher your age, the more you are at risk for serious illness.  Live in a nursing home or long-term care facility.  Have cancer.  Have a long-term (chronic) disease such as: ? Chronic lung disease, including chronic obstructive pulmonary disease or asthma. ? A long-term disease that lowers your body's ability to fight infection (immunocompromised). ? Heart disease, including heart failure, a condition in which the arteries that lead to the heart become narrow or blocked (coronary artery disease), a disease which makes the heart muscle thick, weak, or stiff (cardiomyopathy). ? Diabetes. ? Chronic kidney disease. ? Sickle cell disease, a condition in which red blood cells have an abnormal "sickle" shape. ? Liver disease.  Are obese. What are the signs or symptoms? Symptoms of this condition can range from mild to severe. Symptoms may appear any time from 2 to 14 days after being exposed to the virus. They include:  A fever or chills.  A cough.  Difficulty breathing.  Headaches, body aches, or muscle aches.  Runny or stuffy (congested) nose.  A sore throat.  New loss of taste or smell. Some people may also have stomach problems, such as nausea, vomiting, or diarrhea. Other people may not have any symptoms of COVID-19. How is this diagnosed? This condition may be diagnosed based on:  Your signs and symptoms, especially if: ? You live in an area with a COVID-19 outbreak. ? You recently traveled to or from an area where the virus is common. ? You   provide care for or live with a person who was diagnosed with COVID-19. ? You were exposed to a person who was diagnosed with COVID-19.  A physical exam.  Lab tests, which may include: ? Taking a sample of fluid from the back of your nose and throat (nasopharyngeal fluid), your nose, or your throat using a swab. ? A sample of mucus from your lungs (sputum). ? Blood tests.  Imaging tests, which  may include, X-rays, CT scan, or ultrasound. How is this treated? At present, there is no medicine to treat COVID-19. Medicines that treat other diseases are being used on a trial basis to see if they are effective against COVID-19. Your health care provider will talk with you about ways to treat your symptoms. For most people, the infection is mild and can be managed at home with rest, fluids, and over-the-counter medicines. Treatment for a serious infection usually takes places in a hospital intensive care unit (ICU). It may include one or more of the following treatments. These treatments are given until your symptoms improve.  Receiving fluids and medicines through an IV.  Supplemental oxygen. Extra oxygen is given through a tube in the nose, a face mask, or a hood.  Positioning you to lie on your stomach (prone position). This makes it easier for oxygen to get into the lungs.  Continuous positive airway pressure (CPAP) or bi-level positive airway pressure (BPAP) machine. This treatment uses mild air pressure to keep the airways open. A tube that is connected to a motor delivers oxygen to the body.  Ventilator. This treatment moves air into and out of the lungs by using a tube that is placed in your windpipe.  Tracheostomy. This is a procedure to create a hole in the neck so that a breathing tube can be inserted.  Extracorporeal membrane oxygenation (ECMO). This procedure gives the lungs a chance to recover by taking over the functions of the heart and lungs. It supplies oxygen to the body and removes carbon dioxide. Follow these instructions at home: Lifestyle  If you are sick, stay home except to get medical care. Your health care provider will tell you how long to stay home. Call your health care provider before you go for medical care.  Rest at home as told by your health care provider.  Do not use any products that contain nicotine or tobacco, such as cigarettes, e-cigarettes, and  chewing tobacco. If you need help quitting, ask your health care provider.  Return to your normal activities as told by your health care provider. Ask your health care provider what activities are safe for you. General instructions  Take over-the-counter and prescription medicines only as told by your health care provider.  Drink enough fluid to keep your urine pale yellow.  Keep all follow-up visits as told by your health care provider. This is important. How is this prevented?  There is no vaccine to help prevent COVID-19 infection. However, there are steps you can take to protect yourself and others from this virus. To protect yourself:   Do not travel to areas where COVID-19 is a risk. The areas where COVID-19 is reported change often. To identify high-risk areas and travel restrictions, check the CDC travel website: wwwnc.cdc.gov/travel/notices  If you live in, or must travel to, an area where COVID-19 is a risk, take precautions to avoid infection. ? Stay away from people who are sick. ? Wash your hands often with soap and water for 20 seconds. If soap and water   are not available, use an alcohol-based hand sanitizer. ? Avoid touching your mouth, face, eyes, or nose. ? Avoid going out in public, follow guidance from your state and local health authorities. ? If you must go out in public, wear a cloth face covering or face mask. Make sure your mask covers your nose and mouth. ? Avoid crowded indoor spaces. Stay at least 6 feet (2 meters) away from others. ? Disinfect objects and surfaces that are frequently touched every day. This may include:  Counters and tables.  Doorknobs and light switches.  Sinks and faucets.  Electronics, such as phones, remote controls, keyboards, computers, and tablets. To protect others: If you have symptoms of COVID-19, take steps to prevent the virus from spreading to others.  If you think you have a COVID-19 infection, contact your health care  provider right away. Tell your health care team that you think you may have a COVID-19 infection.  Stay home. Leave your house only to seek medical care. Do not use public transport.  Do not travel while you are sick.  Wash your hands often with soap and water for 20 seconds. If soap and water are not available, use alcohol-based hand sanitizer.  Stay away from other members of your household. Let healthy household members care for children and pets, if possible. If you have to care for children or pets, wash your hands often and wear a mask. If possible, stay in your own room, separate from others. Use a different bathroom.  Make sure that all people in your household wash their hands well and often.  Cough or sneeze into a tissue or your sleeve or elbow. Do not cough or sneeze into your hand or into the air.  Wear a cloth face covering or face mask. Make sure your mask covers your nose and mouth. Where to find more information  Centers for Disease Control and Prevention: www.cdc.gov/coronavirus/2019-ncov/index.html  World Health Organization: www.who.int/health-topics/coronavirus Contact a health care provider if:  You live in or have traveled to an area where COVID-19 is a risk and you have symptoms of the infection.  You have had contact with someone who has COVID-19 and you have symptoms of the infection. Get help right away if:  You have trouble breathing.  You have pain or pressure in your chest.  You have confusion.  You have bluish lips and fingernails.  You have difficulty waking from sleep.  You have symptoms that get worse. These symptoms may represent a serious problem that is an emergency. Do not wait to see if the symptoms will go away. Get medical help right away. Call your local emergency services (911 in the [place] Do not drive yourself to the hospital. Let the emergency medical personnel know if you think you have COVID-19. Summary  COVID-19 is a  respiratory infection that is caused by a virus. It is also known as coronavirus disease or novel coronavirus. It can cause serious infections, such as pneumonia, acute respiratory distress syndrome, acute respiratory failure, or sepsis.  The virus that causes COVID-19 is contagious. This means that it can spread from person to person through droplets from breathing, speaking, singing, coughing, or sneezing.  You are more likely to develop a serious illness if you are 50 years of age or older, have a weak immune system, live in a nursing home, or have chronic disease.  There is no medicine to treat COVID-19. Your health care provider will talk with you about ways to treat your symptoms.    Take steps to protect yourself and others from infection. Wash your hands often and disinfect objects and surfaces that are frequently touched every day. Stay away from people who are sick and wear a mask if you are sick. This information is not intended to replace advice given to you by your health care provider. Make sure you discuss any questions you have with your health care provider. Document Revised: 04/08/2019 Document Reviewed: 07/15/2018 [employer] Patient Education  2020 [employer]  

## 2019-09-12 LAB — CULTURE, BLOOD (ROUTINE X 2)
Culture: NO GROWTH
Special Requests: ADEQUATE

## 2021-05-30 IMAGING — DX DG CHEST 1V PORT
1 series · 1 of 1 positions shown · non-contrast
Comparison: None.

CLINICAL DATA: Shortness of breath over the last day.

EXAM:
PORTABLE CHEST 1 VIEW

[chest ap]
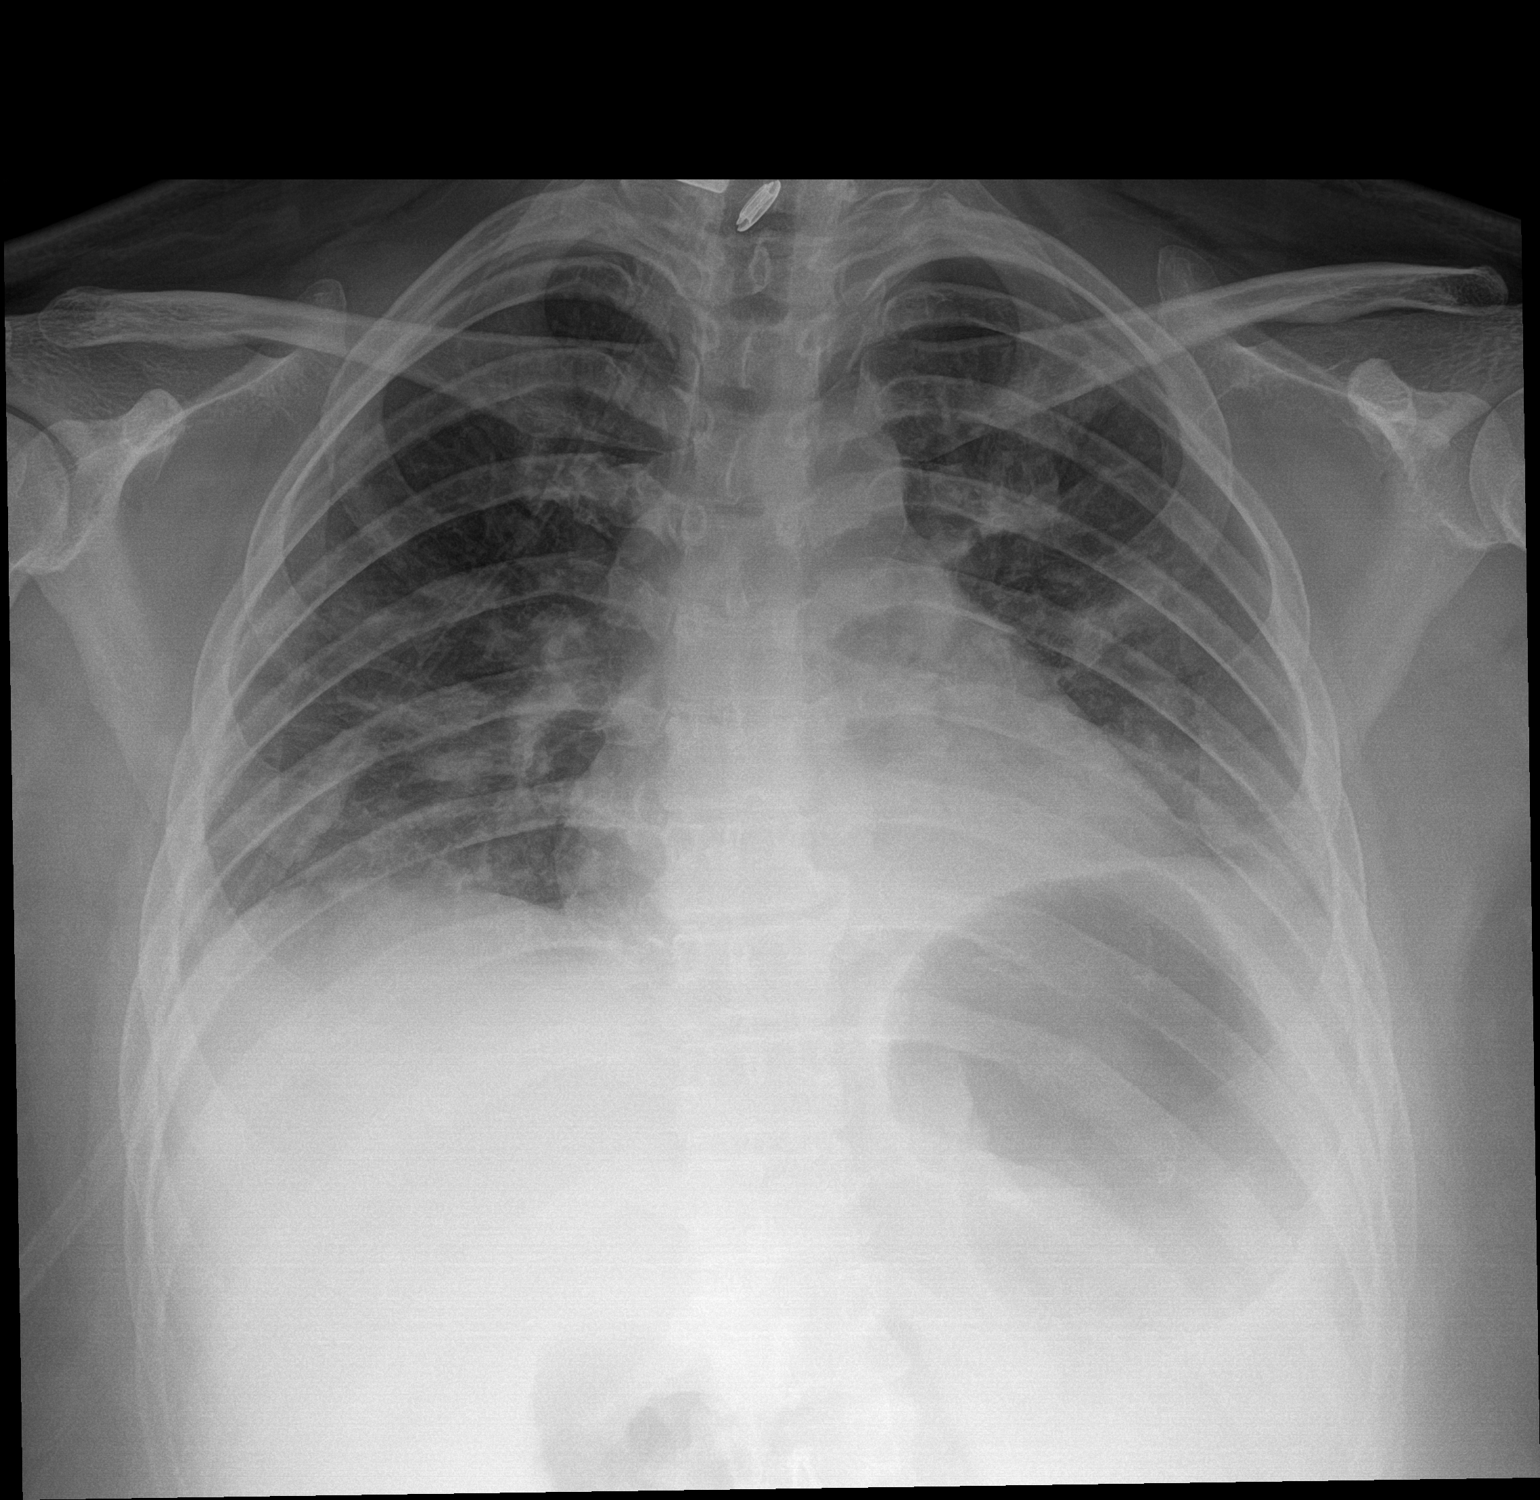

[1 of 1 positions shown; findings below may reference images not displayed]

FINDINGS: Heart size is normal. There are patchy bilateral pulmonary
infiltrates. The pattern is suggestive of viral pneumonia. Bacterial
pneumonia is possible. No evidence of dense consolidation or lobar
collapse. No bone abnormality.
IMPRESSION: Widespread bilateral patchy pulmonary infiltrates. Question
coronavirus pneumonia.

## 2023-09-18 ENCOUNTER — Other Ambulatory Visit: Payer: Self-pay | Admitting: Family Medicine

## 2023-09-18 ENCOUNTER — Ambulatory Visit
Admission: RE | Admit: 2023-09-18 | Discharge: 2023-09-18 | Disposition: A | Discharge status: Home / Self Care | Source: Ambulatory Visit | Attending: Family Medicine | Admitting: Family Medicine

## 2023-09-18 DIAGNOSIS — Z1231 Encounter for screening mammogram for malignant neoplasm of breast: Secondary | ICD-10-CM
